# Patient Record
Sex: Male | Born: 1945 | Race: White | Hispanic: No | Smoking: Current some day smoker
Health system: Southern US, Community
[De-identification: ages and names within clinical notes are randomized; demographics above are authoritative.]

## PROBLEM LIST (undated history)

## (undated) DIAGNOSIS — I779 Disorder of arteries and arterioles, unspecified: Secondary | ICD-10-CM

## (undated) DIAGNOSIS — I2510 Atherosclerotic heart disease of native coronary artery without angina pectoris: Secondary | ICD-10-CM

## (undated) DIAGNOSIS — I639 Cerebral infarction, unspecified: Secondary | ICD-10-CM

## (undated) DIAGNOSIS — T8859XA Other complications of anesthesia, initial encounter: Secondary | ICD-10-CM

## (undated) DIAGNOSIS — T4145XA Adverse effect of unspecified anesthetic, initial encounter: Secondary | ICD-10-CM

## (undated) DIAGNOSIS — I739 Peripheral vascular disease, unspecified: Secondary | ICD-10-CM

## (undated) DIAGNOSIS — Z8659 Personal history of other mental and behavioral disorders: Secondary | ICD-10-CM

## (undated) DIAGNOSIS — R3915 Urgency of urination: Secondary | ICD-10-CM

## (undated) DIAGNOSIS — I10 Essential (primary) hypertension: Secondary | ICD-10-CM

## (undated) DIAGNOSIS — E785 Hyperlipidemia, unspecified: Secondary | ICD-10-CM

## (undated) HISTORY — PX: ROTATOR CUFF REPAIR: SHX139

## (undated) HISTORY — DX: Peripheral vascular disease, unspecified: I73.9

## (undated) HISTORY — DX: Essential (primary) hypertension: I10

## (undated) HISTORY — PX: COLONOSCOPY: SHX174

## (undated) HISTORY — DX: Disorder of arteries and arterioles, unspecified: I77.9

## (undated) HISTORY — DX: Hyperlipidemia, unspecified: E78.5

## (undated) HISTORY — PX: CHOLECYSTECTOMY: SHX55

## (undated) HISTORY — DX: Atherosclerotic heart disease of native coronary artery without angina pectoris: I25.10

---

## 2000-01-16 ENCOUNTER — Inpatient Hospital Stay (HOSPITAL_COMMUNITY): Admission: AD | Admit: 2000-01-16 | Discharge: 2000-01-20 | Payer: Self-pay | Admitting: *Deleted

## 2000-10-11 ENCOUNTER — Emergency Department (HOSPITAL_COMMUNITY): Admission: EM | Admit: 2000-10-11 | Discharge: 2000-10-11 | Payer: Self-pay | Admitting: Emergency Medicine

## 2000-10-11 ENCOUNTER — Encounter: Payer: Self-pay | Admitting: Emergency Medicine

## 2000-10-14 ENCOUNTER — Encounter: Payer: Self-pay | Admitting: Internal Medicine

## 2000-10-14 ENCOUNTER — Inpatient Hospital Stay (HOSPITAL_COMMUNITY): Admission: EM | Admit: 2000-10-14 | Discharge: 2000-10-15 | Payer: Self-pay | Admitting: Cardiology

## 2000-10-14 ENCOUNTER — Encounter: Payer: Self-pay | Admitting: Cardiology

## 2000-11-07 ENCOUNTER — Ambulatory Visit (HOSPITAL_COMMUNITY): Admission: RE | Admit: 2000-11-07 | Discharge: 2000-11-08 | Payer: Self-pay | Admitting: Cardiology

## 2000-11-16 ENCOUNTER — Encounter (HOSPITAL_COMMUNITY): Admission: RE | Admit: 2000-11-16 | Discharge: 2000-12-16 | Payer: Self-pay | Admitting: Cardiology

## 2000-12-18 ENCOUNTER — Encounter (HOSPITAL_COMMUNITY): Admission: RE | Admit: 2000-12-18 | Discharge: 2001-01-17 | Payer: Self-pay | Admitting: Cardiology

## 2001-01-19 ENCOUNTER — Encounter (HOSPITAL_COMMUNITY): Admission: RE | Admit: 2001-01-19 | Discharge: 2001-02-18 | Payer: Self-pay | Admitting: Cardiology

## 2001-02-19 ENCOUNTER — Encounter (HOSPITAL_COMMUNITY): Admission: RE | Admit: 2001-02-19 | Discharge: 2001-03-21 | Payer: Self-pay | Admitting: Cardiology

## 2001-08-03 ENCOUNTER — Ambulatory Visit (HOSPITAL_COMMUNITY): Admission: RE | Admit: 2001-08-03 | Discharge: 2001-08-03 | Payer: Self-pay | Admitting: Internal Medicine

## 2001-08-03 ENCOUNTER — Encounter: Payer: Self-pay | Admitting: Internal Medicine

## 2001-08-27 ENCOUNTER — Ambulatory Visit (HOSPITAL_COMMUNITY): Admission: RE | Admit: 2001-08-27 | Discharge: 2001-08-27 | Payer: Self-pay | Admitting: Family Medicine

## 2001-08-27 ENCOUNTER — Encounter: Payer: Self-pay | Admitting: Family Medicine

## 2001-09-09 ENCOUNTER — Inpatient Hospital Stay (HOSPITAL_COMMUNITY): Admission: EM | Admit: 2001-09-09 | Discharge: 2001-09-11 | Payer: Self-pay | Admitting: Emergency Medicine

## 2001-09-09 ENCOUNTER — Encounter: Payer: Self-pay | Admitting: *Deleted

## 2001-10-11 ENCOUNTER — Encounter: Payer: Self-pay | Admitting: Otolaryngology

## 2001-10-11 ENCOUNTER — Ambulatory Visit (HOSPITAL_COMMUNITY): Admission: RE | Admit: 2001-10-11 | Discharge: 2001-10-11 | Payer: Self-pay | Admitting: Otolaryngology

## 2002-03-05 ENCOUNTER — Encounter: Payer: Self-pay | Admitting: Emergency Medicine

## 2002-03-05 ENCOUNTER — Inpatient Hospital Stay (HOSPITAL_COMMUNITY): Admission: EM | Admit: 2002-03-05 | Discharge: 2002-03-05 | Payer: Self-pay | Admitting: Emergency Medicine

## 2002-06-16 ENCOUNTER — Emergency Department (HOSPITAL_COMMUNITY): Admission: EM | Admit: 2002-06-16 | Discharge: 2002-06-16 | Payer: Self-pay | Admitting: Internal Medicine

## 2002-06-16 ENCOUNTER — Encounter: Payer: Self-pay | Admitting: Internal Medicine

## 2002-06-18 ENCOUNTER — Ambulatory Visit (HOSPITAL_COMMUNITY): Admission: RE | Admit: 2002-06-18 | Discharge: 2002-06-18 | Payer: Self-pay | Admitting: Internal Medicine

## 2002-06-18 ENCOUNTER — Encounter: Payer: Self-pay | Admitting: Internal Medicine

## 2002-06-20 ENCOUNTER — Ambulatory Visit (HOSPITAL_COMMUNITY): Admission: RE | Admit: 2002-06-20 | Discharge: 2002-06-20 | Payer: Self-pay | Admitting: Family Medicine

## 2002-06-21 ENCOUNTER — Encounter: Payer: Self-pay | Admitting: Family Medicine

## 2002-06-21 ENCOUNTER — Ambulatory Visit (HOSPITAL_COMMUNITY): Admission: RE | Admit: 2002-06-21 | Discharge: 2002-06-21 | Payer: Self-pay | Admitting: Family Medicine

## 2002-06-28 ENCOUNTER — Ambulatory Visit (HOSPITAL_COMMUNITY): Admission: RE | Admit: 2002-06-28 | Discharge: 2002-06-28 | Payer: Self-pay | Admitting: Neurology

## 2002-06-28 ENCOUNTER — Encounter: Payer: Self-pay | Admitting: Neurology

## 2003-10-17 ENCOUNTER — Emergency Department (HOSPITAL_COMMUNITY): Admission: EM | Admit: 2003-10-17 | Discharge: 2003-10-17 | Payer: Self-pay | Admitting: Emergency Medicine

## 2004-02-06 ENCOUNTER — Inpatient Hospital Stay (HOSPITAL_COMMUNITY): Admission: EM | Admit: 2004-02-06 | Discharge: 2004-02-10 | Payer: Self-pay | Admitting: Cardiology

## 2004-02-15 ENCOUNTER — Inpatient Hospital Stay (HOSPITAL_COMMUNITY): Admission: AD | Admit: 2004-02-15 | Discharge: 2004-02-17 | Payer: Self-pay | Admitting: *Deleted

## 2004-02-15 ENCOUNTER — Encounter: Payer: Self-pay | Admitting: Emergency Medicine

## 2004-03-16 ENCOUNTER — Ambulatory Visit: Payer: Self-pay | Admitting: Psychology

## 2004-04-21 ENCOUNTER — Encounter (HOSPITAL_COMMUNITY): Admission: RE | Admit: 2004-04-21 | Discharge: 2004-05-21 | Payer: Self-pay | Admitting: Preventative Medicine

## 2004-06-25 ENCOUNTER — Encounter: Admission: RE | Admit: 2004-06-25 | Discharge: 2004-06-25 | Payer: Self-pay | Admitting: Neurosurgery

## 2004-08-20 ENCOUNTER — Ambulatory Visit (HOSPITAL_COMMUNITY): Admission: RE | Admit: 2004-08-20 | Discharge: 2004-08-21 | Payer: Self-pay | Admitting: Neurosurgery

## 2005-03-17 ENCOUNTER — Inpatient Hospital Stay (HOSPITAL_COMMUNITY): Admission: AD | Admit: 2005-03-17 | Discharge: 2005-03-20 | Payer: Self-pay | Admitting: Cardiology

## 2005-03-17 ENCOUNTER — Encounter: Payer: Self-pay | Admitting: Emergency Medicine

## 2006-06-05 ENCOUNTER — Inpatient Hospital Stay (HOSPITAL_COMMUNITY): Admission: EM | Admit: 2006-06-05 | Discharge: 2006-06-08 | Payer: Self-pay | Admitting: *Deleted

## 2007-01-25 HISTORY — PX: CORONARY ARTERY BYPASS GRAFT: SHX141

## 2007-08-20 ENCOUNTER — Encounter: Payer: Self-pay | Admitting: Emergency Medicine

## 2007-08-20 ENCOUNTER — Inpatient Hospital Stay (HOSPITAL_COMMUNITY): Admission: EM | Admit: 2007-08-20 | Discharge: 2007-08-29 | Payer: Self-pay | Admitting: Cardiology

## 2007-08-22 ENCOUNTER — Ambulatory Visit: Payer: Self-pay | Admitting: Surgery

## 2007-08-22 ENCOUNTER — Encounter (INDEPENDENT_AMBULATORY_CARE_PROVIDER_SITE_OTHER): Payer: Self-pay | Admitting: Cardiology

## 2007-09-18 ENCOUNTER — Encounter: Admission: RE | Admit: 2007-09-18 | Discharge: 2007-09-18 | Payer: Self-pay | Admitting: Surgery

## 2007-09-18 ENCOUNTER — Ambulatory Visit: Payer: Self-pay | Admitting: Surgery

## 2008-03-19 ENCOUNTER — Emergency Department (HOSPITAL_COMMUNITY): Admission: EM | Admit: 2008-03-19 | Discharge: 2008-03-19 | Payer: Self-pay | Admitting: Emergency Medicine

## 2009-02-27 IMAGING — CR DG CHEST 2V
2 series · 2 of 2 positions shown · non-contrast
Comparison: 08/27/2007

CLINICAL DATA: Chest pain/post CABG

CHEST - 2 VIEW

[view not recorded (1 of 2)]
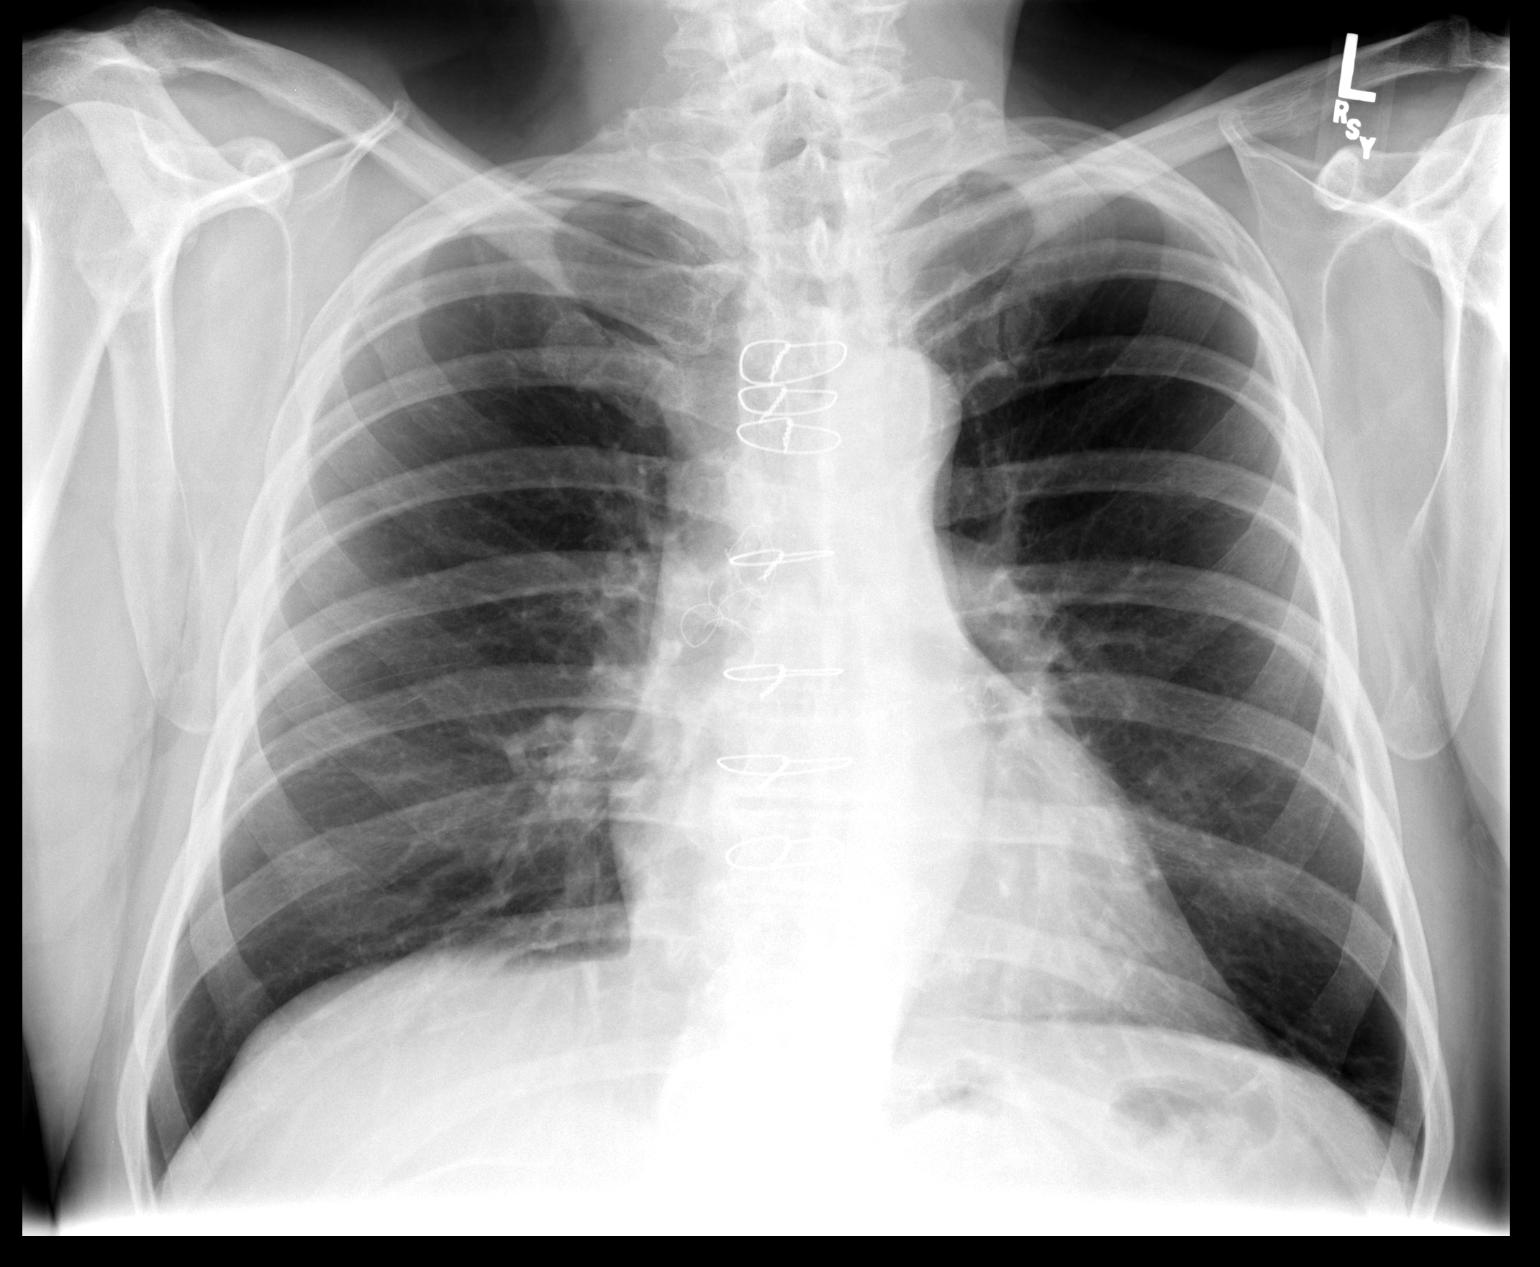

[view not recorded (2 of 2)]
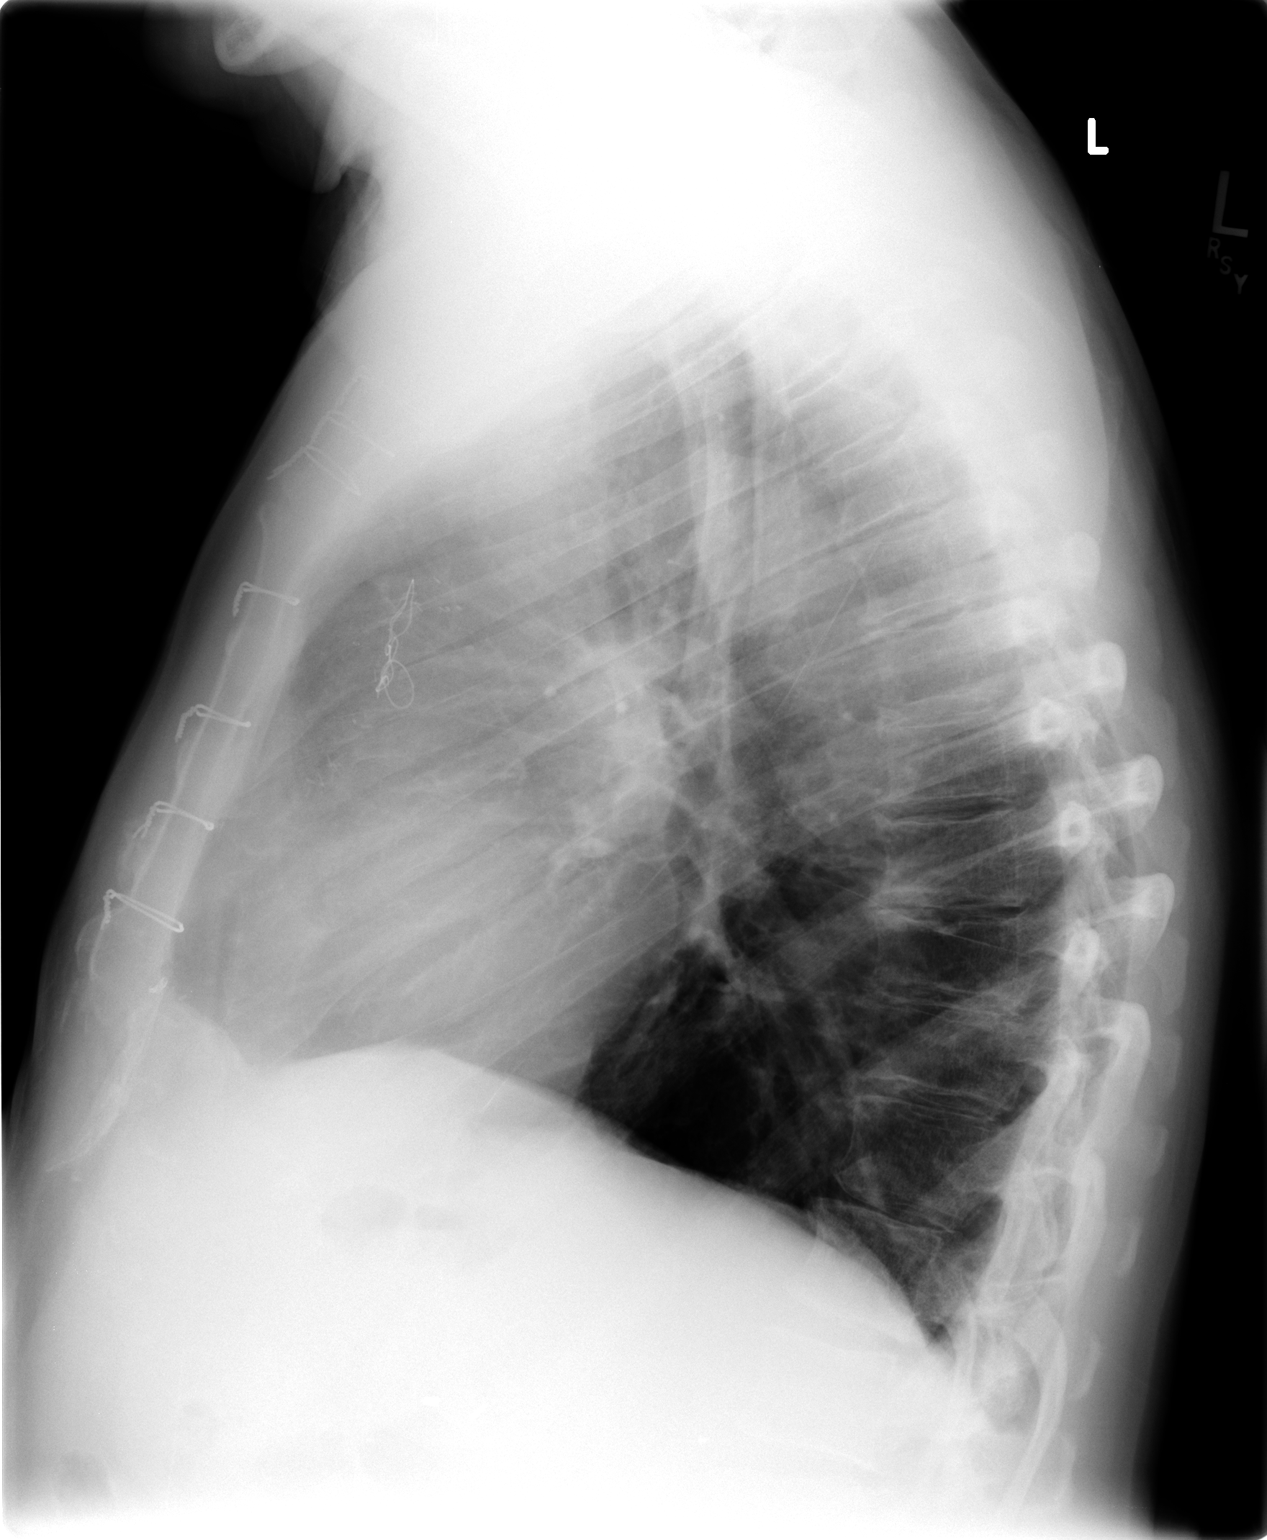

[2 of 2 positions shown; findings below may reference images not displayed]

FINDINGS: The lung bases have cleared.  There is currently no
atelectasis or pleural reaction.  Heart and mediastinal contours
normal.  The lungs are hyperaerated suggesting early COPD.  To
IMPRESSION: 1.  Status post CABG - lung bases now clear.  No active disease.
2.  Probable early COPD.

## 2010-06-08 NOTE — Cardiovascular Report (Signed)
[name], [name] NO.:  [id number]   MEDICAL RECORD NO.:  [id number]          PATIENT TYPE:  INP   LOCATION:  2924                         FACILITY:  [facility]   PHYSICIAN:  Madaline Savage, M.D.DATE OF BIRTH:  12/17/1945   DATE OF PROCEDURE:  08/21/2007  DATE OF DISCHARGE:                            CARDIAC CATHETERIZATION   PROCEDURES PERFORMED:  1. Selective coronary angiography by Judkins technique.  2. Retrograde left heart catheterization.  3. Left ventricular angiography.  4. Aortic root angiography.   COMPLICATIONS:  None.   ENTRY SITE:  Right femoral.   DYE USED:  Omnipaque.   PATIENT PROFILE:  The patient is a 65 year old gentleman with known  coronary disease, tobacco smoking, hyperlipidemia, and noncompliance  with his Plavix.  He recently entered [facility] on March 17, 2005, with chest pain suggestive of unstable angina and underwent  cardiac catheterization and at that time had a high-grade circumflex  stenosis and underwent successful angioplasty and I believe stenting.   Presently, the patient is hospitalized for discontinuation of  medication, continued use of tobacco, he has not been seen since 2007,  and he had stopped all of his medicines because they ran out and I did  not have them refilled.  He now presents with substernal chest pain.  His cardiac enzymes have become positive for non-ST-segment elevation MI  and he enters the cath lab today for diagnostic catheterization, which  was completed without complication.  No interventions were performed.   RESULTS:  Pressures:  The left ventricular pressure was 150/1, end-  diastolic pressure 12, central aortic pressure 140/85, and mean of 105.  There was a 10-mm aortic valve gradient.   ANGIOGRAPHIC RESULTS:  This patient has severe diffuse three-vessel  coronary disease!   Left main coronary artery is short and appears normal.  LAD is  eccentrically stenosed right over the  second septal perforator branch.  There are at least 7-8 septal perforator branches.  The distal LAD  becomes severely stenosed distally, and there would appear to be a  region in the mid LAD that would be potentially bypassable.  This  disease must be viewed on a viewer to understand the widespread severity  of the disease that was seen.   The diagonal branch of the LAD is severely stenosed at its bifurcation  point and it is a 2-mm vessel.   There is an optional diagonal branch, which takes off proximally and is  severely diseased, especially in the mid segment greater than 75%.   The circumflex coronary artery contains a radio-opaque stent at the  proximal portion of a large obtuse marginal branch but that stent is  patent; however, the distal runoff is into a bifurcation of the obtuse  marginal branch and that vessel is riddled with diffuse disease that  does not appear to be amenable to percutaneous intervention.  The  ongoing circumflex coursing through the atrioventricular groove,  collateralizes itself, and is severely diseased diffusely.   The right coronary artery is a medium-size vessel containing a  radiopaque stent in the midportion of the vessel; however,  there is a  recanalized occlusion of the distal RCA that appears to be receiving  small amounts of blood from homocollaterals and retrograde collaterals.  The distal RCA is not a candidate for bypass grafting.   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   Left ventricular angiography shows generalized hypokinesis with an EF  estimated at 25%.  No mitral regurgitation seen.  No LV thrombus.   Aortic root angiography shows a smooth contour to the ascending aorta,  very little in the way of plaque, and is fairly an unremarkable  ascending aorta.   FINAL IMPRESSION:  1. The patient has severe widespread three-vessel coronary disease       that appears to be poor candidate for bypass grafting.  2. Left ventricular ejection fraction is 25%.   PLAN:  The patient will be considered for continued medical therapy.  He  will be restarted on his Plavix.           ______________________________  Madaline Savage, M.D.     WHG/MEDQ  D:  08/21/2007  T:  08/22/2007  Job:  [id number]   cc:   Cristy Hilts. [name], MD

## 2010-06-08 NOTE — Op Note (Signed)
[name], NEVEL NO.:  [id number]   MEDICAL RECORD NO.:  [id number]          PATIENT TYPE:  INP   LOCATION:  2304                         FACILITY:  [facility]   PHYSICIAN:  Evelene Croon, M.D.     DATE OF BIRTH:  September 24, 1945   DATE OF PROCEDURE:  08/24/2007  DATE OF DISCHARGE:                               OPERATIVE REPORT   PREOPERATIVE DIAGNOSIS:  Severe three-vessel coronary disease status  post non-ST-segment elevation myocardial infarction.   POSTOPERATIVE DIAGNOSIS:  Severe three-vessel coronary disease status  post non-ST-segment elevation myocardial infarction.   OPERATIVE PROCEDURE:  Median sternotomy, extracorporeal circulation,  coronary artery bypass graft surgery x4 using a free left internal  mammary artery graft to the left anterior descending coronary artery,  with a saphenous vein graft to the diagonal branch of the left anterior  descending artery, a saphenous vein graft to the obtuse marginal branch  of the left circumflex coronary artery, and a saphenous vein graft to  the posterior descending coronary artery.  Endoscopic vein harvesting  from the right leg.   ATTENDING SURGEON:  Evelene Croon, MD   ASSISTANT:  Rowe Clack, [facility]   ANESTHESIA:  General endotracheal.   CLINICAL HISTORY:  This patient is a 65 year old gentleman with a  history of ongoing smoking and multiple prior percutaneous interventions  with stents as well as ischemic cardiomyopathy with an ejection fraction  of 25%-30%.  His last catheterization was performed in May 2008 that  showed multivessel diffuse disease that was treated medically.  He now  presented with a 49-month history of substernal chest pain with exertion  and this has been worsening.  He was admitted on August 20, 2007 with a  more severe episode and ruled in for a non-ST-segment elevation MI.  His  chest pain resolved and he was transferred to [facility] where he  underwent catheterization on August 21, 2007.  This showed severe diffuse  three-vessel coronary artery disease.  The LAD had about 90% proximal  stenosis just before the takeoff of a small diagonal branch.  The distal  LAD wrapped around the apex and was diffusely diseased distally with 75%  and 90% stenoses near the apex.  The left circumflex had 75% proximal  stenosis.  The previously placed stent was patent.  Then there was 80%  stenosis before the bifurcation of the large marginal branch.  One of  the subbranches was diffusely diseased and not graftable and the other  subbranch was a moderate-sized graftable vessel.  The right coronary  artery had 75% proximal stenosis and was occluded distally at the level  of the posterior descending branch with filling of the distal vessel by  collaterals.  The patient had moderate left ventricular dysfunction.  There was no significant mitral regurgitation.  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  After review of the angiogram and  examination of the patient, it was felt that coronary bypass graft  surgery was the best treatment.  I discussed the operative procedure  with the patient including alternatives, benefits, and risks including  but  not limited to bleeding, blood transfusion, infection, stroke,  myocardial infarction, graft failure, and death.  He understood and  agreed to proceed.   OPERATIVE PROCEDURE:  The patient was taken to the operating room and  placed on table in supine position.  After induction of general  endotracheal anesthesia, a Foley catheter was placed in bladder using  sterile technique.  Then the chest, abdomen, and both lower extremities  were prepped and draped in usual sterile manner.  The chest was entered  through a median sternotomy incision and the pericardium opened in the  midline.  Examination of the heart showed good ventricular  contractility.  The ascending aorta had no palpable plaques in it.   Then the left internal mammary  artery was harvested from the chest wall  as a free graft.  This was a medium caliber vessel.  When it was divided  distally, it had surprisingly good flow through it which was must have  been coming from the left subclavian artery distal to the area of  occlusion.  I still decided to use this as a free graft.  At the same  time, a segment of greater saphenous vein was harvested from the right  leg using endoscopic vein harvest technique.  This vein was of medium  size and good quality.   Then the patient was heparinized when adequate activated clotting time  was achieved.  The distal ascending aorta was cannulated using a 20-  French aortic cannula for arterial inflow.  Venous outflow was achieved  using a 2-stage venous cannula for the right atrial appendage.  An  antegrade cardioplegia and vent cannula was inserted in the aortic root.   The patient was placed on cardiopulmonary bypass and distal coronaries  were identified.  The LAD was a large graftable vessel in its  midportion.  It was diffusely diseased in the distal third and had  diffuse coronary plaque within it.  The diagonal branch was small and  diffusely diseased but felt to be graftable.  The obtuse marginal vessel  was intramyocardial throughout its entire extent, but could be located  in its distal portion where it was small but graftable.  The right  coronary artery was diffusely diseased.  The posterior descending artery  was located proximally where it was small but graftable with no distal  disease in it.  The posterolateral branches were all small vessels.  The  main body of the right coronary artery was severely diseased.   Then the aorta was crossclamped and 1000 mL of cold blood antegrade  cardioplegia was administered in the aortic root with quick arrest of  the heart.  Systemic hypothermia through 20 degrees centigrade and  topical hypothermic with iced saline was used.  A temperature probe was  placed in  septum and insulating pad in the pericardium.   The first distal anastomosis was then performed to the posterior  descending coronary artery.  The internal diameter of this vessel was  about 1.6 mm.  The conduit used was a segment of greater saphenous vein  and the anastomosis was performed in an end-to-side manner using  continuous 8-0 Prolene suture.  Flow was noted through the graft and was  excellent.   The second distal anastomosis was performed to the obtuse marginal  branch.  The internal diameter distally was about 1.6 mm.  The conduit  used was a second segment of greater saphenous vein and the anastomosis  was performed in an end-to-side manner using  continuous 7-0 Prolene  suture.  Flow was noted through the graft and was excellent.   The third distal anastomosis was performed to diagonal branch.  The  internal diameter of this vessel was about 1.4-1.5 mm.  The conduit used  was a third segment of greater saphenous vein and the anastomosis was  performed in an end-to-side manner using continuous 8-0 Prolene suture.  Flow through this vessel was fair.  There was a small vessel distally.  It was also diffusely diseased.  Then another dose of cardioplegia was  given down the vein grafts and in the aortic root.  The fourth distal  anastomosis was performed to the midportion of left anterior descending  coronary artery.  The internal diameter was about 2 mm.  The conduit  used was the free left internal mammary graft.  This was anastomosed in  an end-to-side manner using continuous 8-0 Prolene suture.  The pedicle  was sutured to the epicardium with 6-0 Prolene sutures.  The patient was  then rewarmed to 37 degrees centigrade.  With the crossclamp in place,  another dose of cardioplegia was given and then the patient was rewarmed  to 37 degrees centigrade.  The three proximal vein graft anastomoses  were performed in the aortic root end-to-side manner using continuous 6-  0  Prolene suture.  Then the proximal anastomosis of the left internal  mammary graft was performed to the hood of the obtuse marginal vein  graft in an end-to-side manner using continuous 7-0 Prolene suture.  Then the clamp was removed from the aorta with a crossclamp time of 91  minutes.  There was spontaneous return of sinus rhythm.  The proximal  and distal anastomoses appeared hemostatic while the grafts were  satisfactory.  Graft markers were placed around the proximal  anastomoses.  Two temporary right ventricular and right atrial pacing  wires were placed and brought through the skin.   When the patient had rewarmed to 37 degrees centigrade, he was weaned  from cardiopulmonary bypass on low-dose dopamine.  Total bypass time was  108 minutes.  Cardiac function appeared good with cardiac output of 4.5  L/min.  Protamine was given and venous and aortic cannulas were removed  without difficulty.  Hemostasis was achieved.  Three chest tubes were  placed with two in the posterior pericardium, one in left pleural space,  and one in anterior mediastinum.  The pericardium was loosely  reapproximated over the heart.  The sternum was closed with #6 stainless  steel wires.  The fascia was closed with continuous #1 Vicryl suture.  Subcutaneous tissues were closed with continuous 2-0 Vicryl and the skin  with 3-0 Vicryl subcuticular closure.  Lower extremity vein harvest site  was closed in layers in similar manner.  The sponge, needle, and  instrument counts were correct according to the scrub nurse.  Dry  sterile dressing was applied over the incisions around the chest tubes  which were Pleur-Evac suction.  The patient remained hemodynamically  stable and was transferred to the SICU in guarded but stable condition.      Evelene Croon, M.D.  Electronically Signed     BB/MEDQ  D:  08/24/2007  T:  08/25/2007  Job:  [id number]   cc:   Madaline Savage, M.D.

## 2010-06-08 NOTE — H&P (Signed)
NAMELONZY, MATO NO.:  [id number]   MEDICAL RECORD NO.:  [id number]          PATIENT TYPE:  INP   LOCATION:  2928                         FACILITY:  [facility]   PHYSICIAN:  Ulyses Amor, MD DATE OF BIRTH:  19-Dec-1945   DATE OF ADMISSION:  06/05/2006  DATE OF DISCHARGE:                              HISTORY & PHYSICAL   [name] is a 65 year old white man who is admitted to [facility] for further evaluation of chest pain.   The patient has an extensive history of coronary artery disease.  He has  undergone multiple percutaneous interventions.  Stents have been placed  into the obtuse marginal in October of 2002, right coronary artery in  January of 2006, LAD in January of 2006, and first obtuse marginal again  in February of 2007.  He is known to have an ischemic cardiomyopathy  with an ejection fraction in the range of 30-35% because of non-ST  elevation myocardial infarctions.   The patient presented to the emergency department at [facility] this evening with chest pain.  This began at approximately 5  p.m. while he was on the couch.  The chest pain was described as sharp  and pressure in the upper right anterior chest.  It radiated to his left  arm.  It was not associated with dyspnea, diaphoresis, or nausea.  There  were no exacerbating or ameliorating factors.  It appeared not to be  related to position, activity, meals, or respirations.  He did not take  any nitroglycerin at home.  The chest pain was ameliorated by  nitroglycerin given to him in the [facility] emergency  department.  Intravenously nitroglycerin was started, and his chest pain  has largely resolved at this time (approximately 6 hours after its  onset).   The patient believes that this chest pain is similar in quality to his  prior cardiac chest pain.  He notes multiple brief episodes of this same  chest pain at rest over the last 3  days.  He is free of chest pain at  this time and otherwise asymptomatic.   The patient is noncompliant.  He does not take any medications and he  has continued smoking.   The patient has a number of risk factors for coronary artery disease  including hypertension, dyslipidemia, and smoking (1 to 1-1/2 packs of  cigarettes per day continuing).  There is no history of diabetes  mellitus or family history of coronary artery disease.   The patient is also known to have a 50% left renal artery stenosis.  Other medical problems include chronic obstructive pulmonary disease and  a history of a transient ischemic attack.   MEDICATIONS:  None.   ALLERGIES:  BETA BLOCKER intolerance.   PAST SURGICAL HISTORY:  Back surgery, cholecystectomy, and two rotator  cuff repairs.   SOCIAL HISTORY:  The patient smokes as described above.  He drinks rare  alcohol.  He is a retired Curator.   FAMILY HISTORY:  There is no family history of coronary artery disease.  REVIEW OF SYSTEMS:  No new problems related to his head, eyes, ears,  nose, mouth, throat, lungs, gastrointestinal system, genitourinary  system, or extremities.  There is no history of neurologic or  psychiatric disorder.  There is no history of fever, chills, or weight  loss.   PHYSICAL EXAMINATION:  VITAL SIGNS:  Blood pressure 103/83, pulse 62 and  regular, respirations 15, pulse oximetry 96% on room air.  GENERAL:  The patient was a middle-aged white man in no discomfort.  He  was alert, oriented, appropriate, and responsive.  HEENT:  Normal.  NECK:  Without thyromegaly or adenopathy.  Carotid pulses were palpable  bilaterally and without bruits.  HEART:  Normal S1 and S2.  There was no S3, S4, murmur, rub, or click.  Cardiac rhythm was regular.  No chest wall tenderness was noted.  LUNGS:  Clear.  ABDOMEN:  Soft and nontender.  There was no mass, hepatosplenomegaly,  bruit, distention, rebound, guarding, or rigidity.  Bowel  sounds are  normal.  RECTAL:  GENITOURINARY:  Not performed as they are not pertinent to the  reason for acute care hospitalization.  EXTREMITIES:  Without edema, deviation, or deformity.  Radial and  dorsalis pedal pulses were palpable bilaterally.  NEUROLOGY:  Brief screening neurologic survey was unremarkable.   The electrocardiogram revealed normal sinus rhythm.  There was a  nonspecific intraventricular conduction delay.  Inferior ST segment  depression was noted suggesting the possibility of ischemia.  The chest  radiograph report was pending at the time of this dictation.   LABORATORY DATA:  Potassium 3.5, BUN 11, creatinine 1.25.  White count  was 13.5 with hemoglobin 15.3 and hematocrit 43.0.  Cardiac enzymes were  pending at the time of this dictation.  All other laboratory studies  were pending at the time of this dictation.   IMPRESSION:  1. Unstable angina.  2. Coronary artery disease, status post non-ST segment elevation      myocardial infarctions.  3. Status post multiple stents including the first obtuse marginal in      October of 2002, right coronary artery in January of 2006, left      anterior descending in January of 2006, and first obtuse marginal      again in February of 2007.  4. Ischemic cardiomyopathy.  Ejection fraction 30-35%.  5. Hypertension.  6. Dyslipidemia.  7. Peripheral vascular disease.  50% left renal artery stenosis.  8. Chronic obstructive pulmonary disease.  9. History of transient ischemic attack.  10.Noncompliance.  The patient is not taking any medications and he      continues smoking cigarettes.   PLAN:  1. Telemetry.  2. Serial cardiac enzymes.  3. Aspirin.  4. Intravenous heparin.  5. Intravenous nitroglycerin.  6. Discontinuation of smoking discussed with the patient.  7. No beta blocker due to intolerance.  8. Further measures per Darlin Priestly, MD      Ulyses Amor, MD Electronically Signed     MSC/MEDQ   D:  06/05/2006  T:  06/06/2006  Job:  [id number]   cc:   Darlin Priestly, MD

## 2010-06-08 NOTE — Discharge Summary (Signed)
[name], STOCKING NO.:  [id number]   MEDICAL RECORD NO.:  [id number]          PATIENT TYPE:  INP   LOCATION:  3704                         FACILITY:  [facility]   PHYSICIAN:  Cristy Hilts. [name], MD       DATE OF BIRTH:  1945-07-26   DATE OF ADMISSION:  06/05/2006  DATE OF DISCHARGE:  06/08/2006                               DISCHARGE SUMMARY   DISCHARGE DIAGNOSES:  1. Unstable angina, catheterization this admission revealing diffuse      coronary artery disease, plan is for medical therapy.  2. LV dysfunction with an EF of 25 to 30%.  3. Peripheral vascular disease with 60% narrowing in the aorta below      the renals with normal renal arteries and normal iliacs.  4. Smoking.  5. Non-compliance.  6. Prior coronary disease with multiple interventions in the past.   HOSPITAL COURSE:  The patient is a 65 year old male who has had several  interventions in the past.  He had an OM-1 intervention in October of  2002 with a bare metal stent, he had a DES placed to the RCA in January  of 2006, DES to the LAD January 2006, an OM-1 intervention with another  DES February 2007.  He presented Jun 05, 2006 with chest pain consistent  with unstable angina at the [facility].  He has been not taking  any of his medications and continues to smoke.  He was started on  aspirin, nitrates, and heparin, and transferred to [facility] and seen by Dr.  Orson Slick on admission.  Troponins were negative.  He was set up for  diagnostic catheterization Jun 06, 2006, which was done by Dr. Tresa Endo.  The proximal RCA has a 90% narrowing, the previously placed RCA stent  after this was patent with a total distal RCA with some faint left to  right collaterals to the distal vessel.  Circumflex was patent.  The LAD  stent was patent with a 70 to 80% narrowing immediately after the stent  and a 70% narrowing in the distal LAD.  Circumflex had a 40% narrowing  with a patent OM-1 stent.  EF was 25 to 30%.   There was some narrowing  in the distal aorta above the iliacs and below the renals.   PLAN:  Continued medical therapy.   He did spike a fever to 100.9 on Jun 07, 2006, and we kept him 24 hours.  His fever did not recur.  We feel he can be discharged Jun 08, 2006.  We  have asked for a care management consult to help him get Medicare or  Medicaid, and assistance with medications.  All of his medications are  generics.  He will follow up with Dr. [name] as an outpatient, and Dr.  Nobie Putnam.   DISCHARGE MEDICATIONS:  1. Metoprolol 50 mg one-half b.i.d..  2. Coated aspirin 325 every day.  3. Isosorbide 30 mg a day.  4. Simvastatin 40 mg a day.  5. Lisinopril 5 mg a day.  6. Nitroglycerin sublingual p.r.n.   LABORATORY DATA:  White count 8.4,  hemoglobin 12.8, hematocrit 37.5,  platelets 210.  Sodium 137, potassium 4, BUN 7, creatinine 1.  Troponins  are negative times 3.  LDL is 126, cholesterol 129, HDL 25.  Blood  cultures done on the 14th showed no growth.  Urine culture is pending.  BMP was 117.  Chest x-ray on admission showed low lung volumes with left  basilar scar.  Urinalysis unremarkable.  INR is 1.  EKG showed sinus  rhythm with a T wave inversion in V3 through V6, as well as leads 1, 2,  and L.   DISPOSITION:  The patient is discharged in stable condition.  He has  been counseled on smoking and we have tried to give him as much  assistance as we can in getting Medicaid and medication assistance.  We  will see him back in follow up in [place].      Abelino Derrick, P.A.      Cristy Hilts. [name], MD  Electronically Signed    LKK/MEDQ  D:  06/08/2006  T:  06/08/2006  Job:  [id number]   cc:   Patrica Duel, M.D.

## 2010-06-08 NOTE — Cardiovascular Report (Signed)
NAMEDENG, KEMLER NO.:  [id number]   MEDICAL RECORD NO.:  [id number]          PATIENT TYPE:  INP   LOCATION:  2928                         FACILITY:  [facility]   PHYSICIAN:  Nicki Guadalajara, M.D.     DATE OF BIRTH:  Aug 30, 1945   DATE OF PROCEDURE:  06/06/2006  DATE OF DISCHARGE:                            CARDIAC CATHETERIZATION   INDICATIONS:  [name] is a 65 year old gentleman with known  coronary artery disease.  He is status post multiple percutaneous  coronary interventions with stenting of his obtuse marginal vessel in  October 2002, right coronary artery January 2006, LAD January 2007, and  first obtuse marginal February 2007.  He has an ischemic cardiomyopathy  with an ejection fraction of 35%.  He has been noncompliant and  continues to smoke cigarettes.  He also has COPD, history of TIA.  He  was admitted to [facility] yesterday with recurrent chest pain  and possible unstable angina.  Definitive cardiac catheterization was  recommended.   PROCEDURE:  After premedication with Versed 2 mg intravenously, the  patient was prepped and draped in the usual fashion.  His right femoral  artery was punctured anteriorly and a 5-French sheath was inserted.  Diagnostic catheterization was done utilizing 5-French Judkins-4 left  and right coronary catheters.  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  Hemostasis was obtained by direct manual pressure.  The patient  tolerated the procedure well.   HEMODYNAMIC DATA:  Central aortic pressure was 177/102.  Left  ventricular pressure was 177/19.   ANGIOGRAPHIC DATA:  There was evidence for coronary calcification  involving predominantly the LAD system.   The left main coronary  was angiographically normal and bifurcated into  an LAD and left circumflex system.   The ostium of the LAD had 20-30% narrowing.  A diminutive diagonal  vessel arose from this segment.  The stent which had been placed in the  proximal LAD was widely patent.  Beyond the stented segment in the LAD  before the second diagonal vessel, there was somewhat eccentric  narrowing of 70-80% diffusely.  There was diffuse long 70% narrowing in  a small mid distal LAD system as well as 70% apical LAD and a small LAD  segment.   The circumflex vessel had 30% ostial narrowing followed by 30-40% smooth  narrowing before the first marginal vessel.  The stent which had been  placed in the proximal portion of the first marginal vessel was widely  patent.  There was 50% stenoses in a distal branch of this marginal  vessel as well as in the distal portion of this marginal vessel.   There was collateralization from the AV groove circumflex to the distal  RCA.  Collaterals were also noted to the distal RCA from the apical  portion of the LAD.   The right coronary artery had diffuse  80-90% proximal stenosis in a  small caliber vessel.  The stent in the proximal to mid RCA was patent.  The RCA before the crux was occluded.   Selective angiography in the left subclavian artery was performed in the  event CBG surgery was necessary.  This demonstrated total occlusion of  the proximal circumflex.  Selective injection was therefore made in the  right innominate system to visualize the right internal mammary artery  which was patent.   RAO ventriculography revealed LV dilation with an ejection fraction of  25-30%.  There was severe hypocontractility in the anterolateral wall  with dyskinesis of the apex and hypocontractility inferiorly.   Aortic arch aortography was performed which showed a patent right  innominate artery, 30% proximal narrowing of the carotid arising from  the arch and total occlusion of the  left subclavian vessel arising from  the arch.   Distal aortography revealed fairly normal renal arteries.  There was a  concentric narrowing of the infrarenal aorta of approximately 60-70%.   IMPRESSION:  1. Ischemic cardiomyopathy with an ejection fraction of 25-30% in this      patient status post prior myocardial infarction and multiple      coronary interventions.  2. Significant native coronary artery disease with coronary      calcification and evidence for 30% ostial LAD stenosis with a      widely patent stent in the proximal LAD but with evidence for 70-      80% narrowing in the proximal to mid LAD beyond the stented segment      before the second diagonal vessel with diffuse small caliber mid      distal LAD with narrowings of 70% diffusely; 30% ostial circumflex      narrowing with 30-40% proximal circumflex and evidence for a widely      patent stent in the OM vessel with 50% stenoses distally and with      evidence for left-to-right collateralization to the PDA/PLA system      of the distal right coronary artery.  3. Diffuse 80-90% proximal RCA stenosis with patent stent in the      proximal third of the RCA with total occlusion of the RCA before      the crux and with evidence for left-to-right collaterals as noted      above.  4. 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  5. 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   RECOMMENDATIONS:  Cineangiograms will be reviewed with colleagues.  The  patient is hypertensive.  He will require aggressive additional  increased medication.  Unfortunately, he continues to smoke and has been  noncompliant with his medications which undoubtedly has contributed to  his CAD progression.  Final recommendations will be forthcoming upon  angiographic review with colleagues and possible consultants.           ______________________________ Nicki Guadalajara, M.D.     TK/MEDQ  D:   06/06/2006  T:  06/06/2006  Job:  [id number]   cc:   Cristy Hilts. [name], MD  Patrica Duel, M.D.

## 2010-06-08 NOTE — Assessment & Plan Note (Signed)
OFFICE VISIT   [name], [name]  DOB:  April 23, 1945                                        September 18, 2007  CHART #:  [id number]   The patient returned today for followup, status post coronary artery  bypass graft surgery x4 on 08/24/2007.  He had a free left internal  mammary graft due to occlusion of the left subclavian artery at its  origin.  Since discharge, he has been feeling fairly well overall,  although he is still somewhat tired.  He has had no chest pain or  shortness of breath.   PHYSICAL EXAMINATION:  His blood pressure is 106/69.  His pulse is 69  and regular.  Respiratory rate is 18 and unlabored.  Oxygen saturation  on room air is 95%.  He looks well.  Cardiac exam shows regular rate and  rhythm with normal heart sounds.  His lung exam is clear.  The chest  incision is healing well and the sternum is stable.  His leg incision is  healing well and there is no lower extremity edema.   Followup chest x-ray shows clear lung fields and no pleural effusions.   MEDICATIONS:  1. Aspirin 81 mg daily.  2. Lisinopril 5 mg daily.  3. Plavix 75 mg daily.  4. Zocor 40 mg daily.  5. Carvedilol 6.25 mg b.i.d.  6. Oxycodone p.r.n. for pain.   IMPRESSION:  Overall, the patient is recovering well following his  surgery.  I encouraged him to continue to walk as much as possible.  I  told him he could return to driving a car at this time, but should  refrain from lifting anything heavier than 10 pounds for a total of 3  months from the  date of surgery.  He will continue to follow up with [facility] and will contact me, if he develops any problems with his  incisions.   Evelene Croon, M.D.  Electronically Signed   BB/MEDQ  D:  09/18/2007  T:  09/19/2007  Job:  [id number]   cc:   [facility]

## 2010-06-08 NOTE — Consult Note (Signed)
NAMEDEVONTRE, [name] NO.:  [id number]   MEDICAL RECORD NO.:  [id number]          PATIENT TYPE:  INP   LOCATION:  2005                         FACILITY:  [facility]   PHYSICIAN:  Evelene Croon, M.D.     DATE OF BIRTH:  [birth year]   DATE OF CONSULTATION:  08/22/2007  DATE OF DISCHARGE:                                 CONSULTATION   REFERRING PHYSICIAN:  Madaline Savage, MD   REASON FOR CONSULTATION:  Severe three-vessel coronary artery disease  status post non-ST-segment elevation MI.   CLINICAL HISTORY:  I was asked by Dr. Elsie Lincoln to evaluate [name] for  consideration of coronary artery bypass graft surgery.  He is a 65-year-  old white male smoker with a history of multiple prior PCIs with stents  as well as ischemic cardiomyopathy with ejection fraction of 25%-30%.  His last catheterization was in May 2008 showing diffuse multivessel  disease that was treated medically.  He now presents with about a 1-  month history of substernal chest pain with exertion that has been  worsening with at least one episode per day occurring for several days  with minimal exertion.  He was admitted on August 20, 2007 after having  more severe and long-lasting episode and presenting to the [facility]  Emergency Department.  He ruled in for a non-ST segment elevation MI  with a troponin of 0.28 and a CPK of 105 and MB of 6.6.  He had  resolution of his chest pain after admission and was transferred to  [facility] for further care.  Cardiac catheterization was performed on  August 21, 2007 that showed severe diffuse three-vessel disease.  The LAD  had about 90% proximal stenosis just before the takeoff of a small  diagonal branch.  The distal LAD wrapped around the apex.  The distal  vessel did have significant diffuse disease with 75% and 90% stenoses.  The left circumflex had 75% proximal stenosis and previously placed  stent was patent.  Then there was 80% stenosis before the  bifurcation of  a large marginal branch.  One of the subbranches was diffusely diseased  and probably not graftable.  The other subbranch was a moderate-sized  and graftable.  The right coronary artery had 75% proximal stenosis and  was occluded distally at the level of the posterior descending branch  with filling of the distal vessel by collaterals.  The patient had  moderate left ventricular dysfunction.  There was no significant mitral  regurgitation.  The left subclavian was also occluded at its origin.   REVIEW OF SYSTEMS:  GENERAL:  He denies any fever or chills.  He has had  no recent weight changes.  He does report fatigue for several months.  EYES:  Negative.  ENT:  Negative.  ENDOCRINE:  He denies diabetes and  hypothyroidism.  CARDIOVASCULAR:  As above.  Denies PND or orthopnea.  He has had exertional dyspnea.  He denies peripheral edema and  palpitations.  RESPIRATORY:  He denies cough and sputum production.  GI:  He has had no nausea or vomiting.  He denies melena and bright red blood  per rectum.  GU:  He denies dysuria and hematuria.  MUSCULOSKELETAL:  He  denies arthralgias and myalgias.  HEMATOLOGIC:  Negative.  ALLERGIES:  None.  NEUROLOGICAL:  He denies any focal weakness or numbness.  He  denies dizziness and syncope.  He has never had a TIA or stroke.   MEDICATIONS PRIOR TO ADMISSION:  Aspirin, Lopressor 25 mg b.i.d., Imdur  30 mg daily, Zocor 40 mg daily, lisinopril 5 mg daily, and sublingual  nitroglycerin p.r.n.  Of note is that he ran out of his medicines about  2 months ago and said that he never had them refilled and has not been  taking any of them.   PAST MEDICAL HISTORY:  Significant for history of coronary disease as  mentioned above, status post multiple PCIs and stents.  He had a bare-  metal stent in his obtuse marginal in 2002, a drug-eluting stent in his  right coronary in 2006, a stent in his LAD in 2007, and a stent in his  first marginal in 2007.   He has history of ischemic cardiomyopathy with  ejection fraction of 25%-30%.  He has history of peripheral vascular  disease involving his abdominal aorta and lower extremities.  He is  status post back surgery.  He is status post cholecystectomy.  He is  status post rotator cuff surgery.   SOCIAL HISTORY:  He is retired and lives alone.  He smokes one pack of  cigarettes per day and drinks about 2-3 drinks per week of alcohol.   FAMILY HISTORY:  Negative for cardiac disease.   PHYSICAL EXAMINATION:  VITAL SIGNS:  Blood pressure was 115/92 with a  pulse of 70 and regular.  Respiratory rate 16 and unlabored.  GENERAL:  He is a well-developed white male in no distress.  HEENT:  Normocephalic and atraumatic.  Pupils are equal and reactive to  light and accommodation.  Extraocular muscles were intact.  Oral exam  shows poor dentition.  NECK:  Normal carotid pulses bilaterally.  There were no bruits.  There  is no adenopathy or thyromegaly.  CARDIAC:  Regular rate and rhythm with normal heart sounds.  LUNGS:  Clear.  ABDOMEN:  Active bowel sounds.  His abdomen is soft and nontender.  No  palpable masses or organomegaly.  EXTREMITIES:  No peripheral edema.  Pedal pulses are palpable  bilaterally.  His radial pulses are palpable bilaterally but is  diminished on the left compared to the right.  His extremities are pink  and warm and well-perfused.  NEUROLOGIC:  Alert and oriented x3.  Motor and sensory exams are grossly  normal.   Electrocardiogram shows sinus bradycardia with left ventricular  hypertrophy with repolarization abnormality.  He has anterior T-wave  inversions.   IMPRESSION:  [name] has severe diffuse three-vessel coronary  disease presenting with unstable angina and non-ST segment elevation  myocardial infarction.  While he is not an ideal candidate for coronary  artery bypass surgery due to the diffuse nature of his coronary disease,  I think it is the best  long-term treatment for him.  His left anterior  descending and diagonal as well as the obtuse marginal and posterior  descending appeared graftable.  His long-term results will be more  unpredictable given the diffuse nature of his coronary disease, but I  think results will be considerably better than continued medical  treatment.  I did discuss the importance of maximum cardiac risk factor  reduction and medical compliance including complete smoking cessation  and taking all of his medications as directed.  He understands all this  and would like to proceed with surgery.  We did discussed the benefits  and risks of surgery including but not limited to bleeding, blood  transfusion, infection, stroke, myocardial infarction, graft failure,  and death.  We will attempt and plan to do surgery on Friday, August 24, 2007.      Evelene Croon, M.D.  Electronically Signed     BB/MEDQ  D:  08/23/2007  T:  08/24/2007  Job:  [id number]   cc:   Cristy Hilts. [name], MD

## 2010-06-08 NOTE — Discharge Summary (Signed)
[name], [name] NO.:  [id number]   MEDICAL RECORD NO.:  [id number]          PATIENT TYPE:  INP   LOCATION:  2015                         FACILITY:  [facility]   PHYSICIAN:  Evelene Croon, M.D.     DATE OF BIRTH:  1945/05/17   DATE OF ADMISSION:  08/20/2007  DATE OF DISCHARGE:  08/29/2007                               DISCHARGE SUMMARY   HISTORY OF PRESENT ILLNESS:  The patient is a 65 year old white male  with a long history of coronary artery disease and ischemic  cardiomyopathy.  He has had multiple previous PCI and stent  interventions over the past approximately 7 years.  The patient had not  been seen in the cardiology office since 2007 and he reports that he  stopped taking all his medications greater than 2 months ago.  He  reported that they ran out and he did not get them refilled.  Recently,  he has had approximately 1 month history of substernal chest pain with  exertion that has progressively increased to approximately 1 episode per  day.  These episodes were worse, relieve with rest and associated with  radiation to the left arm.  There was no associated shortness of breath,  nausea, or diaphoresis.  On the date of admission, the episode was more  severe and lasted longer causing him to present to the [facility]  Emergency Department.  He was placed on a nitroglycerin and heparin drip  and transferred to [facility] for unstable angina for further  evaluation and treatment.   ALLERGIES:  He reportedly has a history of beta-blocker intolerance but  has been on metoprolol until approximately 2 months ago without  significant complaints.   Home were medications were stopped.  Medications that he was suppose to  be taking were as follows;  1. Aspirin 1 daily.  2. Lopressor 25 mg twice daily.  3. Imdur 30 mg daily.  4. Simvastatin 40 mg daily.  5. Lisinopril 5 mg daily.  6. Sublingual nitroglycerin p.r.n.   Past medical history includes  ischemic cardiomyopathy with reported  ejection fraction of 25-30%.   OTHER DIAGNOSES:  1. Coronary artery disease with previous non-ST-segment elevation      myocardial infarction.  2. History of multiple previous percutaneous coronary interventions.  3. History of hypertension.  4. History of peripheral vascular occlusive disease.  5. History of back surgery.  6. History of cholecystectomy.  7. History of rotator cuff repairs.  8. History of noncompliance.   Family history, social history, review of systems, and physical exam,  please see the history and physical note at the time of admission.   HOSPITAL COURSE:  The patient was admitted.  He was continued on  intravenous heparin for unstable angina.  He did rule in for the non-ST-  segment elevation myocardial infarction.  Cardiac catheterization was  felt to be necessary for further evaluation of his current coronary  anatomy to better determine a plan.  This was done on August 21, 2007.  Findings included severe diffuse coronary artery disease.  Due to these  findings, surgical  consultation was obtained with Evelene Croon who  evaluated the patient and studies and recommended surgical  revascularization as his best option.  This was discussed with the  patient and agreed upon and on August 24, 2007, the following procedure  was performed.   PROCEDURE:  Coronary artery bypass grafting x4 the following grafts were  placed.  1. Free left internal mammary artery to the LAD.  2. Saphenous vein graft to the posterior descending.  3. Saphenous vein graft to the obtuse marginal.  4. Saphenous vein graft to the diagonal.   INTRAOPERATIVE FINDINGS:  Diffuse coronary artery disease.  Note the  left internal mammary artery was used as free graft due to subclavian  stenosis.  The patient tolerated the procedure well and was taken to the  surgical intensive care unit in a stable condition.   POSTOPERATIVE HOSPITAL COURSE:  Overall, the  patient has done quite  well.  He was extubated without difficulty, neurologically intact.  He  initially required Nipride for hypertension but this was weaned as were  all lines, monitors, and drain devices in the standard fashion.  He does  have a postoperative acute blood loss anemia and did require  transfusion.  These values have stabilized.  His most recent hemoglobin  and hematocrit dated August 28, 2007, are 9.6 and 28 respectively.  Electrolytes, BUN, and creatinine are within normal limits.  Oxygen has  been weaned and he maintained good saturations on room air.  His  incisions are healing well without evidence of infection.  He has  maintained a normal sinus rhythm without significant cardiac ectopy or  dysrhythmias.  He has responded well to a gentle diuresis.  He has  advanced in a routine manner using standard postop cardiac  rehabilitation protocols.  His capillary blood glucoses had been under  good control initially with the Glucommander protocol and then Lantus  with sliding scale.  His overall status was felt to be tentatively  stable for discharge in the morning of August 29, 2007, pending morning  round re-evaluation.   INSTRUCTIONS:  The patient will receive written instructions in regards  to medications, activity, diet, wound care, and followup.   FOLLOWUP:  Include Dr. [name], 2 weeks postdischarge and Dr. Laneta Simmers on  September 18, 2007, at 11:45 at the [facility] office.   Medications on discharge are as follows;  1. Aspirin 325 mg daily.  2. Lopressor 25 mg twice daily.  3. Zocor 40 mg daily.  4. Lisinopril 5 mg daily.  5. Oxycodone 5 mg 1-2 every 4-6 hours as needed for pain.   FINAL DIAGNOSES:  1. Severe multivessel coronary artery disease with unstable angina.  2. Non ST-segment elevation myocardial infarction on this admission,      now status post surgical revascularization as described.   OTHER DIAGNOSES:  1. Ischemic cardiomyopathy.  2. Multiple previous  percutaneous interventions.  3. Hypertension.  4. Peripheral vascular occlusive disease.  5. History of previous back surgery.  6. History of previous cholecystectomy.  7. History of rotator cuff repairs.  8. History of medical noncompliance.  9. Postoperative acute blood loss anemia.      Rowe Clack, P.A.-C.      Evelene Croon, M.D.  Electronically Signed    WEG/MEDQ  D:  08/28/2007  T:  08/29/2007  Job:  [id number]   cc:   Cristy Hilts. [name], MD  Evelene Croon, M.D.

## 2010-06-11 NOTE — H&P (Signed)
NAME:  [name], [name]                         ACCOUNT NO.:  [id number]   MEDICAL RECORD NO.:  [id number]                   PATIENT TYPE:  INP   LOCATION:  IC04                                 FACILITY:  [facility]   PHYSICIAN:  Gracelyn Nurse, M.D.              DATE OF BIRTH:  1945/02/07   DATE OF ADMISSION:  03/05/2002  DATE OF DISCHARGE:                                HISTORY & PHYSICAL   CHIEF COMPLAINT:  Chest pain.   HISTORY OF PRESENT ILLNESS:  This is a 65 year old white male with known  history of coronary artery disease, who presents with chest pain.  The pain  is substernal and does not radiate.  It awoke him from sleep.  He has had no  associated shortness of breath, no diaphoresis, no nausea.  The pain was  relieved by three nitroglycerins.  This pain was similar to chest pain he  has had before when he has had to have stents placed.   PAST MEDICAL HISTORY:  1. Coronary artery disease.     a. 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  2. Hyperlipidemia.  3. GERD.  4. Depression.  5. Chronic headache.     a. Status post nerve block.     b. Status post rotator cuff surgery.   ALLERGIES:  No known drug allergies.   CURRENT MEDICATIONS:  1. Celexa 40 mg daily.  2. Protonix 40 mg daily.   SOCIAL HISTORY:  He drinks alcohol occasionally.  He smokes one and a half  packs of cigarettes per day.  He is married with three children.  He works  as an Insurance account manager.   FAMILY HISTORY:  Mother died at the age of 46 of congestive heart failure.  Father died at age 64 from a stroke.  He has one brother with hypertension.   REVIEW OF SYSTEMS:  Review of systems as per HPI and remainder of systems  negative.   PHYSICAL EXAMINATION:  VITAL SIGNS:  Pulse is 66, respirations 20, blood  pressure  134/72.  GENERAL:  This is a well-nourished white male in no acute distress.  HEENT:  Pupils are equal, round and reactive to light.  Extraocular  movements intact.  Oral mucosa is moist.  Oropharynx is clear.  CARDIOVASCULAR:  Regular rate and rhythm.  No murmurs.  LUNGS:  Lungs clear to auscultation.  ABDOMEN:  The abdomen is soft, nontender and nondistended.  Bowel sounds  positive.  EXTREMITIES:  No edema.  NEUROLOGIC:  Cranial nerves II-XII grossly intact.  No focal deficits.  SKIN:  Skin is moist with no rash.   LABORATORY AND ACCESSORY CLINICAL DATA:  The EKG shows normal sinus rhythm,  no ST abnormalities.  Admission labs:  White blood cell count is 10.6, hemoglobin 13, platelets  272,000.  Sodium 135, potassium 4.0, chloride 107, CO2 29, BUN 9, creatinine  1.0, glucose 112.  CK-MB is 2.7 and troponin I is 0.02.   ASSESSMENT AND PLAN:  1. Chest pain.  The patient does have a known history of coronary artery     disease.  I am going to go ahead and start aspirin, beta blocker and     Lovenox, give him nitroglycerin p.r.n.  He is currently pain-free.  The     emergency room physician spoke with Dr. Aram Candela. Tysinger, who is on call     for [facility].  They were going to make arrangements to     transfer to [facility], however, there were no beds available.     He is stable and pain-free at this time, so we are going to keep him at     [facility], admit him to telemetry and rule him out.  We will consult     [facility] in the morning to see him to see if they are     going to stress him up here or send him to [facility] for a cardiac     catheterization.  Going over his medication list, he is no longer on     aspirin and his ACE inhibitor and his hyperlipidemic drugs.  I do not     understand at this point why he is off of these, but I will hold off     restarting his ACE inhibitor because of his blood pressure and will hold     off on his statin  drugs and recheck a fasting lipid panel.  2. Gastroesophageal reflux disease.  We will continue his Protonix.  3. Depression.  We will continue his Celexa.                                               Gracelyn Nurse, M.D.    JDJ/MEDQ  D:  03/05/2002  T:  03/05/2002  Job:  [id number]

## 2010-06-11 NOTE — Discharge Summary (Signed)
[facility]  Patient:    [name], [name]                      MRN: [id number] Adm. Date:  [id number] Disc. Date: [id number] Attending:  Otilio Saber                           Discharge Summary  BRIEF HISTORY:  [name] is a 65 year old white married male admitted with a history of acute depression and suicidal ideation.  The patient reports that he learned graphic details of his wifes affair from her email and had become markedly distraught.  He confronted her and had been unable to work subsequently due to his emotional distress.  He began drinking and parked his car in the church parking lot with thoughts of killing himself with carbon monoxide.  He was found by friends and taken to the [facility] ER.  He reported decreased sleep and decreased appetite recently.  PAST PSYCHIATRIC HISTORY:  The patient had been hospitalized at [facility] in 1995 with depression and apparently had been treated with Tranxene.  He checked himself out AMA because he did not like the group leaders language.  He had no subsequent psychiatric follow-up.  He denied any history of substance abuse.  PAST MEDICAL HISTORY:  He is followed medically by Dr. Patrica Duel in [place], [place].  He had a history of chronic indigestion but denied any other significant medical problems.  He had two surgeries for rotator cuff injuries and had also had a cholecystectomy.  ADMISSION MEDICATIONS:  Xanax 0.25 mg p.r.n.  ALLERGIES:  He had no known drug allergies.  PHYSICAL EXAMINATION:  Performed at [facility] with no significant findings.  His blood alcohol level was 83 and he was slightly hypokalemic and was given 20 mEq of K-Dur.  MENTAL STATUS EXAMINATION:  On admission, casually-dressed white male.  Speech was normal.  Thought processes were logical and coherent without evidence of psychosis.  He had some passive suicidal ideation but was able to  contract for safety on the unit.  Mood and affect were fairly euthymic.  Oriented x 3. Cognitive function was intact.  ADMITTING DIAGNOSES: Axis I:    1. Adjustment disorder.            2. Rule out major depression. Axis II:   Deferred. Axis III:  Rule out gastritis. Axis IV:   Psychosocial stressors severe. Axis V:    Global Assessment of Functioning:  Current 45; highest past year            24.  LABORATORY FINDINGS:  Admission CBC was normal.  Blood chemistries were normal.  Thyroid panel was normal.  Urinalysis was normal.  HOSPITAL COURSE:  Patient was admitted to the [facility] for treatment of his depression and to prevent any suicidal acting out.  He continued to feel distraught about his current situation and was placed on a combination of Celexa and Zyprexa.  He remained distraught over his marital difficulties but was able to gradually calm.  He was denying any further suicidal ideation, saying he had too much to live for with his grandchildren. He felt that he could move on with his life, however.  He was sleeping and eating better and it was felt he was safe enough to be managed again on an outpatient basis.  CONDITION ON DISCHARGE:  Patient discharged in improved condition with  improvement in his mood, sleep and appetite and alleviation of his suicidal ideation.  DISPOSITION:  Patient was discharged home.  FOLLOW-UP:  Dr. Nobie Putnam on February 01, 2000 and was to talk with his [facility] worker.  DISCHARGE MEDICATIONS: 1. Celexa 30 mg q.a.m. 2. Xanax 0.25 mg q.i.d. 3. Zyprexa 2.5-5 mg q.h.s. 4. Trazodone 50 mg q.h.s. 5. Pepcid 20 mg b.i.d.  FINAL DIAGNOSES: Axis I:    Depression not otherwise specified. Axis II:   No diagnosis. Axis III:  History of gastritis. Axis IV:   Psychosocial stressors severe. Axis V:    Global Assessment of Functioning:  Current 50; highest in past year            75. DD:  03/03/00 TD:  03/04/00 Job: [id number]

## 2010-06-11 NOTE — Discharge Summary (Signed)
[name], [name] NO.:  [id number]   MEDICAL RECORD NO.:  [id number]          PATIENT TYPE:  INP   LOCATION:  6532                         FACILITY:  [facility]   PHYSICIAN:  Cristy Hilts. [name], MD       DATE OF BIRTH:  08-22-1945   DATE OF ADMISSION:  02/06/2004  DATE OF DISCHARGE:  02/10/2004                                 DISCHARGE SUMMARY   ATTENDING PHYSICIAN:  Madaline Savage, M.D.   DISCHARGE DIAGNOSES:  1.  Unstable angina pectoris - status post PCI during this admission.  2.  Previously known coronary artery disease with prior stenting of the      obtuse marginal with residual disease in the right coronary artery.  3.  Hypertension.  4.  Dyslipidemia.  5.  Depression.  6.  Gastroesophageal reflux disease.  7.  Tobacco abuse.   DISCHARGE MEDICATIONS:  1.  Plavix 75 mg daily.  2.  Aspirin 81 mg daily.  3.  Lorazepam 0.5 mg b.i.d.  4.  Quinapril 10 mg daily.  5.  Lexapro 20 mg daily.  6.  Protonix 40 mg daily.  7.  Imdur 30 mg daily.  8.  Advicor 20/500 mg daily.  9.  Verapamil 120 mg daily.   HISTORY OF PRESENT ILLNESS/HOSPITAL COURSE:  This is a 65 year old gentleman  with prior history of coronary artery disease status post catheterization in  October of 2002 with stenting of the obtuse marginal, residual disease in  the mid RCA who developed an episode of chest pain while he was at work.  He  denied any shortness of breath or diaphoresis and no radiation of the pain  but at the same time he took nitroglycerin and it helped to relieve pain.  He went to the emergency room to get checked because pain was worrisome to  him and then had another episode of pain in the emergency room.  That is  when he was admitted for evaluation of unstable angina pectoris.   He was admitted to [facility] as rule out MI protocol.  His enzymes were negative.  The EKG did not reveal any significant or acute  STT wave changes.   Dr. [name]  evaluated the patient and made decision to keep him in the  hospital over the weekend for catheterization on Monday, February 09, 2004.   PROCEDURE:  Cardiac catheterization performed on February 09, 2004, and he  noted there were two high grade lesions in the proximal LAD with 80%  diameter reduction of the vessel and 99% to the RCA with aneurysmal  dilatation of the artery just beyond the lesion.  Dr. [name] inserted CYPHER  stent in that RCA lesion with reduction from 99 to 0%.  The patient also has  disease in the circumflex artery.  Proximal circumflex has 60% stenosis.  A  previously inserted stent into obtuse marginal I is patent and obtuse  marginal II had 60% ostial stenosis.  LAD also has distal 90% stenosis.  The  patient tolerated the procedure well, was given 2b 3a substance for bolus  and drip and transferred to telemetry floor in stable condition.   The day of discharge, his blood work was in normal range.  He did not have  any complaints of chest pain, no shortness of breath, no problems with the  groin puncture site.   Dr. [name] assessed the patient and deemed him stable for discharge home.   DISCHARGE INSTRUCTIONS:  No driving.  No lifting greater than five pounds.  No strenuous activity for five days.   DISCHARGE DIET:  Low carb, low cholesterol diet.   The patient was instructed to return to work next Saturday and also stop  clonidine.   Dr. [name] will see the patient on February 23, 2004, at 3:30 p.m.  He also  mentioned in his progress note on the day of discharge that he would review  the films with colleagues and discuss possible PCI for LAD lesion versus  Cardiolite.      Mari   MK/MEDQ  D:  02/10/2004  T:  02/10/2004  Job:  [id number]

## 2010-06-11 NOTE — Cardiovascular Report (Signed)
NAMEKING, PINZON NO.:  [id number]   MEDICAL RECORD NO.:  [id number]          PATIENT TYPE:  INP   LOCATION:  6599                         FACILITY:  [facility]   PHYSICIAN:  Cristy Hilts. [name], MD       DATE OF BIRTH:  Apr 09, 1945   DATE OF PROCEDURE:  02/09/2004  DATE OF DISCHARGE:                              CARDIAC CATHETERIZATION   REFERRED BY:  Patrica Duel, M.D.   PROCEDURE:  1.  Left ventriculography.  2.  Left coronary arteriography.  3.  Percutaneous transluminal coronary angiography and stenting of the mid-      right coronary artery with a 3.0 mm x 13.0 mm CYPHER stent.  4.  Inter-coronary nitroglycerin administration.   INDICATIONS FOR PROCEDURE:  The patient is a 65 year old Caucasian male with  a history of known coronary artery disease, status post angioplasty of the  obtuse marginal-I branch of the circumflex on November 07, 2000, with a 2.75  x 13.0 mm ZETA stent for chest pain, suggestive of angina pectoris.  He was  readmitted to the hospital after he experienced an episode of 15 minutes of  chest pain, very typical for unstable angina.  Given his multiple cardiac  risk factors which include hypertension, mixed hyperlipidemia and continued  smoking and his presentation, he was brought to the cardiac catheterization  laboratory to evaluate his coronary anatomy.   HEMODYNAMIC DATA:  Left ventricular pressure:  109/5.  End diastolic pressure:  11 mmHg.  Aortic pressure:  107/31, with a mean of 87 mmHg.  There was no pressure gradient across the aortic valve.   ANGIOGRAPHIC DATA:  Left ventricle:  The left ventricle size and function  was normal with an ejection fraction estimated at 60%.  There was no  significant mitral regurgitation.   RESULTS:  1.  RIGHT CORONARY ARTERY:  The right coronary artery is a large dominant      vessel.  It has mild diffuse luminal irregularity throughout.  There is      a high-grade 99% stenosis noted in the  mid-segment of the RCA, followed      by post-stenotic aneurysmal dilatation.  This lesion has progressed from      40%-60% from prior cardiac catheterization.  The RCA itself continues as      a large PDA and a large PLA which have diffuse disease.  The PLA has      ostial 50%-60% stenosis.  2.  LEFT MAIN:  The left main coronary artery is a large caliber vessel      which has mild calcification.  3.  LEFT ANTERIOR DESCENDING CORONARY ARTERY:  The left anterior descending      coronary artery is a large caliber vessel.  It has mild tortuosity.  In      the proximal segment at the origin of a large septal perforator there is      an eccentric 80% stenosis.  This is followed by the origin of a moderate-      sized diagonal-I which has mild luminal irregularity.  The LAD itself  has diffuse luminal irregularity in the distal LAD, just before reaching      the apex has a 90% stenosis; however, this vessel is very small.  4.  CIRCUMFLEX CORONARY ARTERY:  The circumflex coronary artery is a large      caliber vessel.  It continues as a large obtuse marginal-I after giving      an AV groove branch.  The AV groove branch has ostial 60% stenosis and      has diffuse disease otherwise.  The obtuse marginal-I is widely patent,      and has a widely patent stent in its mid-segment.  The distal obtuse      marginal has mild diffuse disease.   PROCEDURE:  An interventional six-vessel percutaneous transluminal coronary  angiography and stenting of the RCA with a 3.0 mm x 13.0 mm CYPHER deployed  at 16 atmospheres of pressure.  This stent was post-dilated with a 3.0 mm x  9.0 mm Power Sail at 18 atmospheres of pressure.  Overall the stenosis was  reduced from 99% to 0%.  There was exclusion of the annulus per dilatation  in its mid-segment.  Excellent TIMI-3 flow was maintained at the end of the  procedure.   RECOMMENDATIONS:  The patient will be advised of risk factor modification.  The patient  will be brought back for an elective percutaneous transluminal  coronary angiography of the proximal LAD.  This will be done in about one  week to two weeks.   DESCRIPTION OF PROCEDURE:  The usual sterile preparation using a 6-French  right femoral artery access and a 6-French multi-purpose PT catheter was  advanced into the ascending aorta.  The catheter was gently advanced to the  left ventricle.  The patient was monitored.  Hand contrast injection of the  left ventricle was performed both in the medial and lateral projection.  The  catheter was flushed and then a pullback into the ascending aorta and  pressure gradient across the ascending aorta was monitored.  The right  coronary artery was then selectively engaged and an angiography was  repeated.  Then the catheter was manipulated and engaged in the left middle  coronary artery and an angiography was repeated.  Then the catheter was  pulled out of the body in the usual fashion.   TECHNIQUE OF INTERVENTION:  A 6-French sheath was exchanged for a 7-French  sheath.  Then a 7-French FR4 guide was used to engage the right coronary  artery in the usual fashion.  Then a 190 cm x 0.014 inch ATW marker guide  was utilized to cross into the RCA, and the lesion length was carefully  measured.  This lesion measured approximately about 10 mm.  Then because of  the high-grade stenosis, a 3.0 mm x 8.0 mm Quantum Maverick balloon was  utilized and gentle dilatations in the lesions were performed.  There was a  tight waste noted at the tightest spot.  The balloon dilatations were done  at 4, 6 and 10 atmosphere pressure for 32 to 60 seconds.  A repeat  angiography revealed the presence of residual 40%-50% stenosis.  Because of  the presence of residual stenosis and also aneurysmal dilatation, it was  decided to proceed with stenting.  Then a 3.0 mm x 13.0 mm CYPHER stent was advanced through the lesion, and after carefully positioning the stent,  the  stent was deployed at 16 atmosphere pressure for 60 seconds.  The stent  balloon was deflated  and pulled back into the guiding catheter and an  angiography was repeated.  Then the 3.0 mm x 8.0 mm Power Sail balloon was  re-advanced back through the guiding catheter, and keeping the balloon  within the stent, 18 atmospheres of balloon dilatation x2 was performed  within the stent.  Then the balloon was deflated and pulled back in the  guiding catheter and 100 mcg of inter-coronary nitroglycerin was  administered.  Angiography was repeated.  Excellent results were noted.  After  confirming the success of the results in multiple views, the catheter was  disengaged and pulled out of the body in the usual fashion.   The patient tolerated the procedure well.  No immediate complications were  noted.      Kirk Ruths  D:  02/09/2004  T:  02/09/2004  Job:  [id number]   cc:   Patrica Duel, M.D.  [address], [address]  [place]  [place] [address]  Fax: [phone]

## 2010-06-11 NOTE — Discharge Summary (Signed)
[facility]  Patient:    TINA, TEMME Visit Number: [id number] MRN: [id number]          Service Type: CAT Location: 3700 [id number] Attending Physician:  Pamella Pert Dictated by:   Halford Decamp Delanna Ahmadi, R.N., N.P. Admit Date:  11/07/2000 Discharge Date: 11/08/2000   CC:         Dr. Patrica Duel   Discharge Summary  [facility] COURSE:  Mr. Robby Sermon is a 65 year old white married male patient of Dr. Delrae Rend who was admitted for outpatient cardiac catheterization. He had previously been admitted on October 14, 2000 to October 15, 2000 with chest pain.  He was negative for an MI and he was sent home for an outpatient Cardiolite.  This was performed and it showed anterior and inferior wall thinning with a suggestion of very minimal amount of inferior wall ischemia with an EF of 53%, and he was recommended for outpatient cardiac catheterization.  He came in on November 07, 2000.  This was performed by Dr. Elsie Lincoln.  He had an EF of 55%.  He had a 75% mid circumflex lesion and he had 60% and 40% proximal lesions in his RCA with an aneurysm in-between the lesions.  He went on to have a stent placed in his left circumflex OM-1, reduced from 75% to 0.  In the hospital, his initial lipid profile during his first hospitalization showed a high triglyceride level of 700 with a low HDL of 33; he was put on Tricor.  A repeat cholesterol level this admission revealed a total cholesterol 249, triglycerides 203, HDL 39, and LDL 169; thus, Zocor was added to his medications.  On November 08, 2000 he was seen by Dr. [name], considered stable for discharge home.  We discussed with him his lifestyle changes that needed to be made.  He is agreeable to quitting smoking now.  He is also interested in changing his diet; thus, he will follow up in the lipid clinic at our office.  He was given some instructions about diet prior to his discharge.  CURRENT  MEDICATIONS: 1. Enteric-coated aspirin 325 mg once per day. 2. Plavix 75 mg once per day x 4 weeks. 3. Norvasc 5 mg once per day. 4. Celexa 40 mg once per day. 5. Tricor 60 mg once per day. 6. Protonix 40 mg once per day or Prevacid as taken previously. 7. Accupril 10 mg once per day. 8. Zocor 20 mg once per day.  ACTIVITY:  He should not return to work until October 28 per Dr. [name] and he should do no strenuous activity for four days and no driving x 2 days.  DIET:  He should not eat more than 15 g of saturated fat per day.  He should eat at least seven servings of fruit and vegetables per day.  WOUND CARE:  If he has any problems with his groin he will call our office.  FOLLOW-UP:  He will follow up in the lipid clinic on October 31 at 10 a.m. to discuss his diet.  His wife will be present at that time.  He will follow up with Dr. [name] November 4 at 3 p.m.  DISCHARGE DIAGNOSES: 1. Angina and abnormal Cardiolite test prior to admission. 2. Coronary artery disease status post cardiac catheterization with two-vessel    coronary artery disease, with subsequent left circumflex first obtuse    marginal stent placed.  He has remaining lesion in his proximal right  coronary artery with a 60% and 40% blockage with an aneurysm in-between,    for which Dr. [name] has recommended percutaneous transluminal coronary    angioplasty of his right coronary artery if he has recurrent chest pain. 3. Mixed hyperlipidemia with an atherogenic lipid profile, with low HDL, high    LDL, and high triglyceride level. 4. Tobacco smoking.  Patient is now willing to quit. 5. Gastroesophageal reflux disease. 6. Depression. Dictated by:   Halford Decamp Delanna Ahmadi, R.N., N.P. Attending Physician:  Pamella Pert DD:  11/08/00 TD:  11/08/00 Job: [id number]

## 2010-06-11 NOTE — Procedures (Signed)
   NAME:  [name], [name]                         ACCOUNT NO.:  [id number]   MEDICAL RECORD NO.:  [id number]                   PATIENT TYPE:  INP   LOCATION:  IC04                                 FACILITY:  [facility]   PHYSICIAN:  Edward L. Juanetta Gosling, M.D.             DATE OF BIRTH:  Jun 04, 1945   DATE OF PROCEDURE:  03/05/2002  DATE OF DISCHARGE:  03/05/2002                                EKG INTERPRETATION   0034 March 05, 2002:  The rhythm is a sinus rhythm with a rate of 60.  Questionable left ventricular hypertrophy.  Abnormal electrocardiogram.                                               Oneal Deputy. Juanetta Gosling, M.D.    ELH/MEDQ  D:  03/14/2002  T:  03/14/2002  Job:  [id number]

## 2010-06-11 NOTE — Cardiovascular Report (Signed)
[place]. [facility]  Patient:    [name], [name] Visit Number: [id number] MRN: [id number]          Service Type: CAT Location: [id number] Attending Physician:  Pamella Pert Dictated by:   Madaline Savage, M.D. Proc. Date: 11/07/00 Admit Date:  11/07/2000   CC:         Delrae Rend, M.D., [facility]  Jonell Cluck, M.D., [place]  Cardiac Catheterization Laboratory   Cardiac Catheterization  PROCEDURES PERFORMED: 1. Selective coronary angiography by Judkins technique. 2. Retrograde left heart catheterization. 3. Left ventricular angiography. 4. Intracoronary stent deployment into the mid left circumflex obtuse marginal    branch #1.  COMPLICATIONS: None.  ENTRY SITE: Right femoral.  DYE USED: Omnipaque.  MEDICATIONS GIVEN: Fentanyl 25 IV x2, 4500 units of intravenous heparin, double bolus Integrilin and Integrilin drip.  PATIENT PROFILE: The patient is a 65 year old gentleman who has had a several month history of chest pain. He thinks he has had between 3-5 episodes of chest pain. He was admitted to [facility] overnight in late September for chest pain and had negative cardiac enzymes. He ultimately had an outpatient Cardiolite stress test showing no demonstrable ischemia while on antilipid therapy and ACE inhibitor.  Todays outpatient cardiac catheterization was scheduled by Dr. [name], who is his primary cardiologist for continued concern that this may be coronary disease.  RESULTS: On todays results the pressures were as follows:  PRESSURES: The left ventricular pressure is 120/18, central aortic pressure 120/60.  No aortic valve gradient by pullback technique.  ANGIOGRAPHIC RESULTS: The left main coronary artery was normal in appearance.  The left anterior descending artery coronary artery coursed to the cardiac apex and gave rise to 4-5 septal perforator branches. It was basically  one major diagonal branch arising between septal perforator #2 and #3. No significant lesions were noted in either diagonal or LAD, although there were luminal irregularities throughout the LAD.  The circumflex coronary artery gave rise to one very large bifurcating obtuse marginal branch that was 3 mm in diameter. The proximal circumflex contains some calcification that was faint and the vessel tapers fairly abruptly but the lumen appeared to be fairly well intact.  There was a type B1 lesion in the midportion of the obtuse marginal branch #1 that was 75% severe and was also a B1 lesion. The distal vessel did bifurcate and two 3 mm branches distally. The atrial ventricular groove portion of the circumflex was normal.  The right coronary artery was a dominant vessel that contained a diseased segment between the distal portion of the proximal vessel and virtually the entire portion of the mid right coronary artery. 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 The inflow into this aneurysm contained an area of 60% stenosis and the outflow from the aneurysm was about 40% narrowed. This diseased segment was not felt to be hemodynamically significant at the current time and was therefore not intervened upon later. The distal RCA looked like a 3.25 vessel that was definitely dominant and did not appear diseased.  The left ventricle showed good contractility in two views. Estimated ejection fraction was 55% without wall motion abnormalities.  PERCUTANEOUS INTERVENTION: This was performed using a 7 French catheter system.  The 7 French sheath was exchanged for the 6 French sheath after diagnostic cardiac catheterization was completed.  A 7 French 4 Judkins guide catheter was used without side  holes which was a good choice.  The guide wire utilized was a Nurse, learning disability which steered beautifully down to the proximal circumflex and into to the OM. The  intervention was a direct stent using a 2.75 Zeta Multi-Link stent by [employer]. During placement of the device there were ST segment elevation and some short runs of nonsustained ventricular tachycardia. Pre-dilatation of the lesion with 8-10 atmospheres of pressure yielded a central dimple in the balloon but the balloon had to be deflated in order to stop the VT and the ST elevation. It should be noted that this reproduced his typical chest pain that he had on an outpatient basis, and after balloon deflation the chest pain went away. The case was ultimately finished by recrossing the lesion with the balloon making sure it was well within the stent itself and taking it to 15 atmospheres for a minute 15.  The lesion was reduced from 75% to 0% residual and there was TIMI class 3 flow distal to the stent. ST segment was completely resolved and the patient felt well and had normal sinus rhythm and a normal blood pressure. No complications occurred other than the short-lived chest pain and nonsustained ventricular tachycardia.  FINAL DIAGNOSES: 1. Two-vessel coronary artery disease.    a. A 75% stenosis of mid obtuse marginal branch #1.    b. 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 2. Successful direct stenting of the mid obtuse marginal branch #1 with a    2.75 Multi-Link Zeta stent. 3. Well preserved left ventricular systolic function.  PLAN: The patient should continue his attempts to completely stop smoking. The Tricor should be continued. I am not sure if or why not the patient has ever been placed on a statin agent, but his LDL cholesterol was 170, and every attempt should be made to bring him down to goal. He will follow up with the excellent care of Dr. [name] in [place] and also with Dr. Nobie Putnam in [place]. Dictated by:   Madaline Savage, M.D.  Attending Physician:  Pamella Pert DD:  11/07/00 TD:   11/07/00 Job: [id number]

## 2010-06-11 NOTE — Discharge Summary (Signed)
NAMEDETRELL, UMSCHEID NO.:  [id number]   MEDICAL RECORD NO.:  [id number]          PATIENT TYPE:  INP   LOCATION:  6531                         FACILITY:  [facility]   PHYSICIAN:  Delman Cheadle, MD       DATE OF BIRTH:  27-Sep-1945   DATE OF ADMISSION:  02/15/2004  DATE OF DISCHARGE:  02/17/2004                                 DISCHARGE SUMMARY   PRIMARY CARE PHYSICIAN:  Dr. Patrica Duel.   ADMISSION DIAGNOSES:  1.  Recurrent unstable angina.  2.  History of coronary artery disease.      1.  History of catheterization October, 2002 with stent to the first          obtuse marginal artery.      2.  Status post catheterization February 09, 2004 with percutaneous          transluminal coronary angioplasty stent to the right coronary          artery.  3.  Past tobacco use, has quit since his last percutaneous coronary      intervention.  4.  Hyperlipidemia.  5.  History of transient ischemic attack.  6.  Depression.   DISCHARGE DIAGNOSES:  1.  Recurrent unstable angina.  2.  Coronary artery disease.      1.  History of catheterization October, 2002 with stent to the first          obtuse marginal artery.      2.  Status post catheterization February 05, 2004 with percutaneous          transluminal coronary angioplasty stent to the right coronary          artery.  3.  Past tobacco use, has quite since his last percutaneous coronary      intervention.  4.  Hyperlipidemia.  5.  History of transient ischemic attack.  6.  Depression.  7.  Repeat catheterization February 16, 2004 with percutaneous transluminal      coronary angioplasty stent to the left anterior descending.  8.  Mild bradycardia which will need to be followed.   HISTORY OF PRESENT ILLNESS:  Mr. Bologna is a 65 year old white male with  history of hypertension, past tobacco use, hyperlipidemia, depression,  history of transient ischemic attack in 2004. He also has a history of  coronary artery disease  status post OM-1 stent in October, 2002.  He  recently developed chest pain and was admitted to [facility] for  repeat catheterization February 09, 2004 which revealed a 99% RCA stenosis  for which he underwent PTCA stent.  At that time he did have significant  residual LAD disease as well as some other residual more moderate disease.  He was subsequently discharged home on February 10, 2004. He had no problems  until the evening of this admission. At that time he was watching a football  game when he developed substernal chest pain with associated shortness of  breath, mild nausea and diaphoresis.  He went to [employer] and checked his  blood pressure which was elevated at 180/110. He then went  to the [facility]  emergency room.  EKG there showed no significant changes. He was given  sublingual nitroglycerin x2 with relief of his chest pain. They started IV  heparin, IV nitroglycerin, and by the time of transport to [facility] he had minimal residual chest pain. At this point he was evaluated  by Dr. Lenise Herald.  At this point his blood pressure was 132/97, pulse  92.  No other significant abnormalities on physical exam. Initial point of  care markers were negative and other labs stable.  EKG showed normal sinus  rhythm and no significant ST, T change.  At this point he was seen and  evaluated by Dr. Lenise Herald. Admission was planned to a telemetry unit  for unstable angina with known residual disease of the LAD and diagonal.  He  planned to continue IV heparin, IV nitroglycerin.  He was to start tP3A if  there was recurrent chest pain or enzymes positive.  Also planned for  aggressive blood pressure control. He planned to repeat catheterization  February 16, 2004 to reassess LAD with probable intervention. The risks and  benefits of the procedure were discussed and he was agreeable to proceed.   HOSPITAL COURSE:  On the morning of February 16, 2004 he underwent   catheterization by Dr. Susa Griffins.  See the dictated report for  detail. Ultimately he performed PTCA stent to the proximal LAD. This went  from an 80 to 85% stenosis to a 0% to 10% residual. Again, see the dictated  report for all detail.  The patient tolerated the procedure well without  complications. He did use Aggrastat bolus infusion and planned to continue  the infusion 18 hours post-procedure. Again, see the dictated report for all  findings.   On the morning of February 17, 2004 Mr. Klee is doing well without any  recurrent chest pain.  Heart is in regular rhythm. His lungs are clear. His  right groin site is stable.  At this point he was seen and evaluated by Dr. Susa Griffins who deemed  him stable for discharge home.   We did review his lipid profile which does show LDL of 109. However we also  reviewed that during his recent admission of January 15th, he was just  started on Avecor at that time so we will wait for that to have effect on  his lipid profile. Dr. [name] wrote that the patient had a history of  significant impotence on his beta blocker, therefore he purposefully had put  the patient on Verapamil instead.  At this time Dr. Alanda Amass feels that the  only medication change to be made at this point would be to increase aspirin  to  A full enteric coated aspirin 325 mg.  Otherwise we will place the patient  back on the medications that were filled out on the last discharge summary.   HOSPITAL CONSULTS:  None.   PROCEDURE:  Cardiac catheterization with intervention to the LAD by Dr.  Susa Griffins on February 16, 2004.  See his dictated report for all  detail.   RADIOLOGY:  Chest x-ray on February 15, 2004 shows no acute disease.   LABORATORY DATA:  CBC on admission shows white count 9600, hemoglobin 15.0,  hematocrit 41.6, platelets 311,000.  These all remained stable through the admission. On admission electrolytes showed sodium 136, potassium  3.6, BUN  13, creatinine 1.0, glucose 97, these also remained stable and at time of  discharge home potassium  was 4.1, BUN 10, creatinine 1.0.  Cardiac enzymes  are negative with CK of 82 and 82, CK-MB 2.0 and 2.1, troponin was 0.01.  Lipid profile shows total cholesterol 190, triglyceride 201, HDL 41, LDL  109.   EKG on admission showed sinus rhythm, nonspecific ST, T change.   Telemetry on the morning of discharge currently shows some sinus bradycardia  at 53 beats per minute. However his heart rate has remained 50 or above with  no significant bradycardia.   DISCHARGE MEDICATIONS:  1.  Enteric coated aspirin 325 mg daily.  2.  Plavix 75 mg daily.  3.  Lorazepam 0.5 mg b.i.d. p.r.n.  4.  Quinapril 10 mg daily.  5.  Lexapro 20 mg daily.  6.  Protonix 40 mg daily.  7.  Imdur 30 mg daily.  8.  Avecor 20/500 mg daily.  9.  Verapamil 120 mg daily.  10. Nitroglycerin 0.4 mg sublingual as directed.   DISCHARGE INSTRUCTIONS:  1.  Continue to avoid cigarettes and smoking.  2.  No strenuous activities, no lifting over 5 pounds, sexual activity x3      days.  3.  Low cholesterol, low sodium diet.  4.  Groin site care.  5.  Call [phone] for any bleeding or pain in the groin site.   Keep your appointment to see Dr. [name] February 23, 2004 at 1530 Hr.      MBE/MEDQ  D:  02/17/2004  T:  02/17/2004  Job:  [id number]   cc:   Cristy Hilts. [name], MD  [address]  [place]  [place] [address]  Fax: [phone]   Patrica Duel, M.D.  [address], [address]  [place]  [place] [address]  Fax: [phone]

## 2010-06-11 NOTE — Discharge Summary (Signed)
NAMEHOLSTON, [name] NO.:  [id number]   MEDICAL RECORD NO.:  [id number]          PATIENT TYPE:  INP   LOCATION:  4713                         FACILITY:  [facility]   PHYSICIAN:  Vonna Kotyk R. [name], MD       DATE OF BIRTH:  17-Sep-1945   DATE OF ADMISSION:  03/17/2005  DATE OF DISCHARGE:  03/20/2005                                 DISCHARGE SUMMARY   [name] is a 65 year old male patient with a prior medical history of  coronary artery disease. He is status post bare metal stent to his OM1  October 2002, RCA DES stent February 09, 2004, and LAD DES stent February 16, 2004.  His EF was 55% at that time.  He had done well since that time,  however, he came to the emergency room with congestion and chest pain.  It  woke him up.  He went to [facility] ER.  His first enzymes were  positive and he was transferred here for further evaluation.  He was placed  on IV nitroglycerin, heparin and a 2b3a inhibitor.  He was pain-free when he  was transferred to [facility], thus it was decided to  wait until the following day for cath.  On March 18, 2005, he underwent  cardiac catheterization by Dr. Lenise Herald.  His EF was 30 to 35%.  He  had global hypokinesis.  OM1 mid he had a 95% stenosis.  He underwent CYPHER  stenting 2.5 x 18 reduced from 95 to less than 10%.  He was put on the  CHAMPION trial.  Dr. Jenne Campus though he needed Plavix indefinitely.  The  following day, he was doing fairly well.  He had no further chest pain.  His  medications were titrated and on March 20, 2005, he was seen by Dr. [name]  with plans for discharge home.  His blood pressure is 95/50, his temperature  is 98.5, his heart rate was 61.   LABORATORY DATA:  Hemoglobin was 12.5, hematocrit 34.7, wbc 9, platelets  229.  His sodium was 137, potassium 3.7, chloride 105, CO2 26, BUN 8,  creatinine 1.1, glucose 99.  SGOT was 32, SGPT 22, albumin 3.2, total  protein 6.  CK-MB  #1 327/22, troponin 7.47; #2 263/17.2, troponin 10.68; #3  246/12/7; #4 213/7.7; #5 161/5.  This total cholesterol was 203, his  triglycerides 445, HDL 24, LDL not calculated.   X-ray showed bibasilar atelectasis.   DISCHARGE MEDICATIONS:  1.  Metoprolol 25 mg twice a day.  2.  Zocor 40 mg at bedtime.  3.  Norvasc 5 mg every day.  4.  Lexapro 20 mg every day.  5.  Plavix 75 mg every day.  6.  Lisinopril 10 mg a day.  7.  Niaspan 500 mg at bedtime.  8.  Aspirin 325 mg.  9.  Zantac 300 mg a day.  10. Digoxin 0.125 mg a day.  11. Omacor two every day or Mega III fatty acids, if he takes over-the-      counter, he should take three  or four.   He will return to see Dr. [name] in two weeks.  He will call us for an  appointment.  He should do no strenuous activity, lifting, pulling or  pushing for four days and to wait to play golf until he is seen by Dr.  [name].   DISCHARGE DIAGNOSES:  1.  Non-ST segment elevation myocardial infarction.  2.  Progressive coronary disease, status post cardiac catheterization with      new high grade blockage to his first obtuse marginal.  He also has      diffuse vessel disease distally of his vessels, all about 50% stenosis.  3.  Ischemic cardiomyopathy, ejection fraction at the time of cath was 30 to      35%, previously 55%.  He had global hypokinesis possibly a thin      myocardium having an myocardial infarction.  4.  Tobacco smoking.  5.  Dyslipidemia.  6.  Hypertension.  7.  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  8.  Chronic obstructive pulmonary disease, smoker.  9.  History of transient ischemic attack in 2004.      Lezlie Octave, N.P.      Cristy Hilts. [name], MD  Electronically Signed    BB/MEDQ  D:  03/20/2005  T:  03/21/2005  Job:  [id number]   cc:   Patrica Duel, M.D.  Fax: [phone]

## 2010-06-11 NOTE — Discharge Summary (Signed)
[place]. [facility]  Patient:    [name], [name] Visit Number: [id number] MRN: [id number]          Service Type: MED Location: [id number] Attending Physician:  Pamella Pert Dictated by:   Raymon Mutton, P.A. Admit Date:  10/14/2000 Discharge Date: 10/15/2000   CC:         [name], M.D.   Discharge Summary  DATE OF BIRTH:  1945-03-20.  DISCHARGE DIAGNOSES: 1. Chest pain, rule out ischemia - etiology undetermined, resolved. 2. Hyperlipidemia and hypercholesterolemia. 3. Gastroesophageal reflux disease. 4. Depression.  HOSPITAL CONSULTS:  None.  HOSPITAL PROCEDURES:  None.  HOSPITAL COMPLICATIONS:  None.  HISTORY OF PRESENT ILLNESS:  [name] is a 65 year old Caucasian gentleman who was seen in the emergency department at [facility] the morning of his admission and was complaining of chest pain and pressure and was transferred to [facility] for evaluation.  The first time he was seen at the [facility] Emergency Department with severe chest pain and pressure on October 11, 2000, and they said that it occurred after he was practicing golf. At that time at the emergency department he was diagnosed with pneumonia and started on Septra 800/160 b.i.d.  On October 14, 2000, he went to work, was sitting at work when he experienced another severe episode of chest pain and somewhat crushing sensation his chest with radiation to the base of his neck bilaterally and left arm.  He also had been nauseated and experienced some lightheadedness. He denied diaphoresis or shortness of breath.  The pain scared him because he never experienced such intense pain before and he came to the emergency department at [facility] for an evaluation.  PAST MEDICAL HISTORY:  Depression, gastroesophageal reflux disease, status post cholecystectomy, status post right hip joint bursitis, status  post negative colonoscopy but he was diagnosed as having internal hemorrhoids and complaints of bleeding with bowel movements.  He also underwent bilateral rotator cuff tear repairs.  FAMILY HISTORY:  Positive for coronary artery disease.  Mother had congestive heart failure and some other heart problems and died at age 63.  His father died at age 52 and he had been diagnosed with CVA.  There is no history of diabetes.  HOSPITAL COURSE:  He was admitted to [facility] telemetry unit, started on Lovenox 85 mg mg subcu q.12h., aspirin 325 mg and Accupril 10 mg daily.  We also started him on Protonix 40 mg daily.  We checked his cardiac enzymes, his lipid panel, BMP.  EKG was ordered and chest x-ray.  EKG showed sinus bradycardia and no acute STT wave changes.  Telemetry showed that he maintained sinus rhythm and heart rate was between 56 and 60.  His blood work showed negative CK and CK-MB was borderline.  Troponin 0.05 but this decreased to 0.4 in the second set of enzymes.  Hemoglobin was 15.1 and hematocrit 40.3.  Potassium was 3.8, sodium 136, BUN 9, and creatinine 1.0, glucose 92.  Lipid panel was abnormal showing cholesterol 234, triglycerides 700 and HDL 33, LDL was not calculated.  His physical examination was benign. Blood pressure 118/76, pulse 66, Oxygen saturation was 97% on two liters. Lungs clear to auscultation bilaterally.  Heart was in regular rate and rhythm without any murmurs, rubs, or gallops.  Abdomen was soft, nontender and nondistended. There were positive bowel sounds x 4 quadrants.  Extremities did not  reveal any edema.  The next morning he was reassessed by Delrae Rend, M.D., and the plan was to discharge the patient and follow-up with outpatient Cardiolite to assess the myocardia perfusion and systolic function. He was started on proton pump inhibitors and on Tricor 160 to decrease triglyceride level.  DISCHARGE MEDICATIONS: 1. Celexa  40 mg q.d. 2. Protonix 40 mg q.d. 3. Tricor 160 mg q.d. 4. Nitroglycerin 0.4 mg sublingual.  He was instructed to take up to three    pills within 15 minutes and if pain was not relieved, call the ambulance    or come to emergency room. 5. Accupril 10 mg daily. 6. Enteric coated aspirin 81 mg daily.  DISCHARGE ACTIVITY:  As tolerated.  DISCHARGE DIET:  Low fat, low cholesterol, low salt diet.  OTHER DISCHARGE INSTRUCTIONS:  Discontinue antibiotic since his chest x-ray did not show any active lung disease.  DISCHARGE FOLLOW-UP:  Our office will call patient to arrange outpatient stress test and after this test, he will be reevaluated by Dr. [name].Dictated by:   Raymon Mutton, P.A. Attending Physician:  Pamella Pert DD:  10/26/00 TD:  10/27/00 Job: [id number]

## 2010-06-11 NOTE — H&P (Signed)
NAME:  CHARES, [name]                         ACCOUNT NO.:  [id number]   MEDICAL RECORD NO.:  [id number]                   PATIENT TYPE:  INP   LOCATION:  A213                                 FACILITY:  [facility]   PHYSICIAN:  Gracelyn Nurse, M.D.              DATE OF BIRTH:  05-Dec-1945   DATE OF ADMISSION:  09/09/2001  DATE OF DISCHARGE:                                HISTORY & PHYSICAL   CHIEF COMPLAINT:  Diaphoresis, nausea, and dizziness.   HISTORY OF PRESENT ILLNESS:  This is a 65 year old white male with known  coronary artery disease having a heart cath with stents placed in 2002.  He  was sitting at work today when he had a sudden onset of profuse diaphoresis,  he felt dizzy, weak to the point that he had to lie on the floor and became  nauseated.  He had some mild shortness of breath but did not complain of any  chest pain.   PAST MEDICAL HISTORY:  1. Coronary artery disease.     a. 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  2. Hyperlipidemia.  3. GERD.  4. Depression.  5. Tobacco abuse.  6. Chronic headache.     a. Status post nerve block.  7. Status post cholecystectomy.  8. Status post rotator surgery x2.   ALLERGIES:  No known drug allergies.   CURRENT MEDICATIONS:  1. Enteric-coated aspirin 325 mg q.d.  2. Celexa 40 mg q.d.  3. Tricor 60 mg q.d.  4. Protonix 40 mg q.d.  5. Accupril 10 mg q.d.  6. Zocor 20 mg q.d.  7. Wellbutrin 150 mg b.i.d.  8. Vioxx 25 mg q.d.   SOCIAL HISTORY:  He smokes 1-1/2 packs of cigarettes a day, drinks alcohol  occasionally.  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   FAMILY HISTORY:  Mother died at age 30 of congestive heart failure.  Father  died at age 20 from a stroke.  He does have one brother with hypertension.   REVIEW OF SYSTEMS:  As  per HPI.  All other systems are reviewed and are  normal.   PHYSICAL EXAMINATION:  VITAL SIGNS:  Temperature 97, pulse 62, respirations  20, blood pressure 148/98.  GENERAL:  A well-nourished white male who is diaphoretic.  HEENT:  Pupils equal, round, reacting to light. Extraocular movement intact.  Oral mucosa is moist.  CARDIOVASCULAR:  Regular rate and rhythm, no murmurs.  LUNGS:  Clear to auscultation.  ABDOMEN:  Soft, nontender, nondistended.  Bowel sounds are positive.  EXTREMITIES:  No edema.  NEUROLOGICAL:  Cranial nerves II-XII grossly intact.  No focal deficits.  Strength is 5/5 bilateral.  SKIN:  Moist with no rash.   ADMITTING LABORATORY DATA:  CK is 376 with an MB fraction of 5.3, troponin-I  is 0.01.  Sodium 137, potassium 3.3, chloride 109, CO2 28, BUN 11,  creatinine 1.2, glucose 114.  White blood cells 8.4, hemoglobin 12.8,  platelets 298.   DIAGNOSTIC DATA:  EKG shows sinus bradycardia with a rate of 56, some mild  ST elevation in V2 and V3 but appears to be less than one box.   ASSESSMENT AND PLAN:  1. Sign and symptom complex of diaphoresis, nausea and dizziness.  The     patient had no chest pain but, however, this could be an anginal     equivalent especially given his history of coronary artery disease.  I     feel it is prudent to go ahead and admit the patient to rule out an acute     myocardial infarction, to start him on anticoagulation, beta blocker and     aspirin and p.r.n. nitroglycerin.  He is already on a statin.  Will     continue that.  If he rules out, I believe he still needs to be stress     tested with a Cardiolite.  He is currently seen by [facility]     Cardiology, so we will ask for a consult in the morning to set him up for     stress testing. If he does rule in, then we will make arrangements to     transfer to [facility] for a possible heart cath.  2. Hyperlipidemia--will continue his Zocor.  3. Gastroesophageal reflux disease--will  continue his Protonix.  4. All other medical problems--we will continue to treat with home     medications.                                               Gracelyn Nurse, M.D.    JDJ/MEDQ  D:  09/09/2001  T:  09/10/2001  Job:  [id number]   cc:   Jonell Cluck, M.D.  [address], [address]  [place]  [place] [address]  Fax: [phone]   Cristy Hilts. [name], M.D.   Rene Kocher, M.D.

## 2010-06-11 NOTE — Discharge Summary (Signed)
   NAME:  [name], [name]                         ACCOUNT NO.:  [id number]   MEDICAL RECORD NO.:  [id number]                   PATIENT TYPE:  INP   LOCATION:  IC04                                 FACILITY:  [facility]   PHYSICIAN:  Madelin Rear. Sherwood Gambler, M.D.             DATE OF BIRTH:  [birth year]   DATE OF ADMISSION:  03/05/2002  DATE OF DISCHARGE:  03/05/2002                                 DISCHARGE SUMMARY   DISCHARGE DIAGNOSES:  1. Chest pain.  2. Coronary artery disease.  3. Gastroesophageal reflux disease.  4. Depression.   SUMMARY:  The patient was admitted with a known history of coronary disease,  presented with chest pain.  He had no associated cardiopulmonary symptoms.  Pain was relieved with 3 nitroglycerine.  He was admitted for rule out  myocardial infarction, although he remained chest pain free at the time of  admission.  Cardiac enzymes were negative.  He was discharged home  asymptomatic to follow up outpatient with cardiology.                                               Madelin Rear. Sherwood Gambler, M.D.    LJF/MEDQ  D:  04/05/2002  T:  04/05/2002  Job:  [id number]

## 2010-06-11 NOTE — Op Note (Signed)
NAME:  [name], [name]               ACCOUNT NO.:  [id number]   MEDICAL RECORD NO.:  [id number]          PATIENT TYPE:  OIB   LOCATION:  3014                         FACILITY:  [facility]   PHYSICIAN:  Reinaldo Meeker, M.D. DATE OF BIRTH:  06-10-1945   DATE OF PROCEDURE:  08/20/2004  DATE OF DISCHARGE:                                 OPERATIVE REPORT   PREOPERATIVE DIAGNOSIS:  Herniated disc L4-5 left, extraforaminal.   POSTOPERATIVE DIAGNOSIS:  Herniated disc L4-5 left, extraforaminal.   PROCEDURE:  Left L4-5 extraforaminal microdiscectomy.   SURGEON:  Reinaldo Meeker, M.D.   ASSISTANT:  Izell Archer. Deaton, M.D.   DESCRIPTION OF PROCEDURE:  After being placed in the prone position,  patient's back was prepped and draped in the usual sterile fashion.  Localizing x-ray was taken prior to incision to identify the appropriate  level.  Midline incision was made above the spinous processes of L4 to L5.  Using the Bovie cutting current, the incision was carried down the spinous  processes.  Subperiosteal dissection was then carried out along the left  side of the spinous processes and lamina facet joint down to the transverse  process of L4.  Self-retaining retractor was placed for exposure and x-ray  showed approach of the appropriate level. Lateral one third of the facet  joint was then removed to allow access of the foraminal compartment.  Inferior edge of the transverse process was also removed.  Microscope was  brought in the field and used through the remainder of the case.  Using  microdissection technique, progressive deeper dissection was carried out.  X-  ray was taken which showed slightly superior approach and therefore,  dissection was carried out slightly inferior and slightly more medial by  removing a bit more of the lateral facet joint.  At this time, the L4 nerve  root could be identified.  Medial to it, pushing lateral was a large  foraminal disc herniation.  Disc was  coagulated and incised with a #15  blade.  Using pituitary rongeurs and curettes, thorough disc space clean out  was carried out with great care to well decompress the L4 nerve root.  At  this point, there was no evidence of further disc herniation in the  extraforaminal compartment.  The nerve root was well decompressed.  Large  amounts of irrigation were carried out at this time and any bleeding  controlled with bipolar coagulation and Gelfoam. Wound was then closed in  multiple layers of Vicryl in the muscle fascia, subcutaneous and  subcuticular tissues and staples on the skin.  A sterile dressing was then  applied.  The patient was extubated and taken to the recovery room in stable  condition.       ROK/MEDQ  D:  08/20/2004  T:  08/20/2004  Job:  [id number]

## 2010-06-11 NOTE — Discharge Summary (Signed)
   NAME:  [name], [name]                         ACCOUNT NO.:  [id number]   MEDICAL RECORD NO.:  [id number]                   PATIENT TYPE:  INP   LOCATION:  A213                                 FACILITY:  [facility]   PHYSICIAN:  Corrie Mckusick, M.D.               DATE OF BIRTH:  [birth year]   DATE OF ADMISSION:  09/09/2001  DATE OF DISCHARGE:  09/11/2001                                 DISCHARGE SUMMARY   HISTORY:  For history of presenting illness and past medical history please  see admission H&P.   HOSPITAL COURSE:  A 65 year old gentleman with known coronary artery disease  who presented with complex symptomatology including diaphoresis, nausea, and  dizziness.  He felt like this could be his anginal equivalent and therefore  he was admitted for rule out protocol.  Please see admission H&P for  details.   The day after admission the patient felt well with no complaint; vital signs  were stable.  CPK and troponins were negative at that point.  Cardiology saw  the patient that day.  Dr. Landry Dyke recommendation was ultimately he needed a  Cardiolite stress test.   I saw the patient on September 11, 2001; the patient wanted to go home.  Vital  signs remained stable other than some bradycardia.  Beta blocker was held.  Dr. Domingo Sep was to see the patient prior to discharge.  She was to set up  outpatient carotid studies and felt like he was able for discharge home.   DISCHARGE PHYSICAL EXAMINATION:  Please see note from September 11, 2001.   DISCHARGE MEDICATIONS:  Per Dr. Domingo Sep was no change in medicines.   FOLLOW-UP:  With Dr. Nobie Putnam next week.  Follow up with Dr. Domingo Sep on the  following week.                                               Corrie Mckusick, M.D.    JCG/MEDQ  D:  10/22/2001  T:  10/23/2001  Job:  [id number]
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
## 2010-06-11 NOTE — Cardiovascular Report (Signed)
[name], [name]               ACCOUNT NO.:  [id number]   MEDICAL RECORD NO.:  [id number]          PATIENT TYPE:  INP   LOCATION:  3707                         FACILITY:  [facility]   PHYSICIAN:  [name], M.D.DATE OF BIRTH:  09-Mar-1945   DATE OF PROCEDURE:  02/16/2004  DATE OF DISCHARGE:                              CARDIAC CATHETERIZATION   PROCEDURES:  1.  Central aortic catheterization.  2.  Selective coronary angiography pre and post intracoronary nitroglycerin      administration.  3.  Left ventricular angiogram RAO/LAO projection.  4.  Subselective left internal mammary artery/right internal mammary artery.  5.  Abdominal aortic angiogram mid stream pulmonary artery projection.  6.  Weight adjusted heparin.  7.  IVUS interrogation left anterior descending.  8.  Cutting balloon atherectomy and subsequent long DES stent high pressure      deployment proximal left anterior descending.  9.  Double bolus Aggrastat plus infusion.  10. Plavix 150 extra, continued aspirin.   BRIEF HISTORY:  [name] is a 65 year old white separated father of three  children and two grandchildren, past heavy smoker one and a half packs a day  for 30 years, occasional to mild tobacco continued abuse last several years,  works at [employer] full-time, is active and his son lives with  him.   He has a history of coronary disease, hyperlipidemia, and remote TIA in  2004.  He underwent culprit lesion bare metal stenting with a 2.75 Multilink  of a large obtuse marginal branch of the circumflex November 07, 2000.  Presented with unstable angina and underwent DES Cypher stenting with a 3.0  x 13 stent on February 09, 2003 to the proximal/mid RCA.  He had associated  LAD disease and was to be followed medically and considered for staged  intervention.  In the interim he was admitted to [facility] after an episode of  prolonged chest pain compatible with ischemia on February 15, 2004.  Myocardial infarction was ruled out with serial enzymes and EKGs.  Patient  was referred for recatheterization in this setting.  Heparin was held on-  call to the laboratory and patient was premedicated with 5 mg Valium p.o.  He was brought to the second floor CP Laboratory in the postabsorptive  state.  The right groin was prepped/draped in the usual manner.  1%  Xylocaine was used for local anesthesia.  The CFRA was entered with a single  anterior puncture using 18 thin wall needle with modified Seldinger  technique.  He was given 2 mg of Nubain and 2 mg of Versed for sedation.  Diagnostic coronary angiography was done with a 6-French 4 cm taper  preformed Cordis coronary and pigtail catheters.  Subselective LIMA and RIMA  was done with the right coronary catheter and hand injection.  Abdominal LV  angiogram was done with a pigtail catheter 25 mL 14 mL/second, 20 mL 12  mL/second.  Pullback pressure of the PA was performed that showed no  gradient across the aortic valve.  Abdominal aortic angiogram was done in  the midstream PA projection at 25  mL 20 mL/second with visualization to the  SFA profunda junction bilaterally.  Catheter was removed.  Sidearm sheath  was flushed.  Pending review of the patient's cineangiograms.  He tolerated  the diagnostic procedure well.   PRESSURES:  LV:  140/0.  LVEDP 18/20 mmHg.   CA:  140/80 mmHg.   There was no gradient across the aortic valve on catheter pullback.   LV angiogram demonstrated mild hypokinesis of the mid anterolateral and  septal wall.  There was minimal hypokinesis of the distal inferior wall.  Overall EF was approximately 55%.  No significant mitral regurgitation.   The RIMA was patent.  There was no brachiocephalic stenosis.  There was  calcific 30% left subclavian stenosis with good flow and no gradient and the  LIMA was patent.  Both vertebrals were antegrade.   Abdominal angiogram demonstrated calcific left renal artery  stenosis of  approximately 70% angiographically.  There may have been 40-50% right renal  artery stenosis but there was good flow and I could not see the ostia in the  plane PA view.  The infrarenal abdominal aorta had moderate atherosclerotic  disease.  The IMA, celiac, and SMA were intact.  No significant iliac  disease.  The hypogastrics were intact and SFA profunda junction was intact  bilaterally.   Fluoroscopy showed 2-3+ calcification segmentally in the proximal LAD and  right coronary artery.   The main left coronary was large and normal with no significant stenosis.   The LAD had stenosis beyond the ostia of approximately 80-85%, eccentric,  segmental involving the thin, moderate sized first diagonal branch that  bifurcated.  The first diagonal had 70% proximal stenosis segmentally.   There was an intercurrent area without significant stenosis of the LAD, but  a second tandem area of 85% eccentric stenosis, calcific in the proximal LAD  ending before the second septal perforator and the large DX2.  The DX2 had  60% proximal and ostial disease with good flow.  There was diffuse disease  in the LAD with 40-50% mid and 70-80% distal, but good flow to the apex  where it bifurcated.  There was a large septal perforator that bifurcated  arising from the eccentric proximal LAD disease.   The circumflex had 50-60% smooth eccentric stenosis in the proximal third.  The large first marginal branch had 0% to 10% narrowing in the previously  placed 2.75 Multilink stent from October 2002 with good flow to the two  large bifurcation branches.  The circumflex proper had 60-70% stenosis just  beyond the OM with good flow.  The PABG branch had no significant stenosis  and the distal two circumflex branches had no significant stenosis.   The right was a dominant vessel.  There was 40-50% smooth eccentric narrowing around the proximal bend.  The previously placed 3.0 x 13 Cypher  stent in the  aneurysmal area of high grade stenosis (February 09, 2003)  looked excellent with 0% narrowing and good flow.  There was residual  disease in the right with 50% segmental disease in the mid portion beyond  the RV branch, another tandem 50% beyond the acute margin and before the  distal bifurcation, and 50-60% proximal PLA, another 70-80 mid to distal  third of the PLA, good flow throughout, however.   The patient has high grade eccentric calcific disease of the LAD which is  probably his culprit.  We proceeded with IVUS interrogation and possible  planned PCI depending on these results.  Informed  consent was obtained to  proceed.   He was given weight adjusted heparin.  ACTs were monitored.  A total of 5000  units of heparin were given, another 150 of Plavix.  With therapeutic ACT  the left coronary was intubated with a JL4 6-French Cordis guiding catheter.  A proximal LAD lesion was crossed with a 0.014 Asahi soft wire which was  positioned free in the distal LAD.  An Atlantis Pro IVUS was then crossed  into the mid portion of the LAD and pullback was performed.   The IVUS pullback was done to the main left.  The distal reference vessel  beyond the lesions measured 2.7 x 2.9.  There was rather diffuse moderate  coronary disease around the DX2, but no high grade stenosis.   The tandem LAD stenoses were high grade, eccentric, calcific, and measured  approximately 1.5 x 2.2 mm in both areas.  The proximal reference was 3.2  mm.  The LAD lesion ended before the main left and circumflex bifurcation,  but did involve DX1.   It was elected to proceed with PCI in this setting.  We felt we might have  to sacrifice the DX1 because of its very proximal nature and diffuse  disease.   Cutting balloon atherectomy was performed in the LAD in two overlapping  areas with a 2.5 x 10 cutting balloon dilated at 4-5 atmospheres.  The  balloon was then pulled back.  A 3.0 x 24 long TAXUS Express-II  Scimed DES  stent was then positioned fluoroscopically and deployed at 14 atmospheres  for 29 seconds.  It was redilated to 15 atmospheres for 34 seconds.  It was  deflated, balloon was pulled back, and final injections were performed in  multiple orthogonal projections showing excellent angiographic result with  stenosis reduction of 85% to essentially 0 and 10% in the mid portion.  There was excellent TIMI 3 flow to the distal LAD and to the DX2.  There was  flow to the moderately large septal perforator arising from the mid portion  of the lesion and there was flow to the DX1, although there appeared to be  an ostial pinch of 80-90%.  We did not try to recross the DX1.  The patient  was stable.  There was no EKG changes or chest pain.  The dilatation was  removed.  Sidearm sheath was flushed and secured with #1 silk suture to  prevent migration.  The patient was transferred to the holding area for postoperative care.  We plan to continue 2B3A inhibitor for approximately 18  hours.  Continue aspirin and Plavix.  He still smokes a little bit, needs to  discontinue and he needs vigorous lipid management.  The patient has diffuse  disease, three vessel.  He has had successful recent interventions of his  RCA (February 09, 2003), and today his LAD, and remotely his circumflex.  These were culprit lesion interventions for high grade symptomatic stenosis.  If he has diffuse progression of disease he may eventually require multi  vessel CABG.   CATHETERIZATION DIAGNOSES:  1.  Arteriosclerotic heart disease, diffuse three vessel coronary artery      disease.      1.  Remote CXOM non DES 2.75 Multilink stent November 07, 2000 widely          patent on this study.      2.  Prior mid right coronary artery DES stent 3.0 x 13 Cypher (February 09, 2003) widely patent on this study.      3.  High grade eccentric calcific segmental proximal left anterior          descending stenosis treated  with cutting balloon atherectomy and          subsequent long DES stenting under IVUS interrogation February 16, 2004.  Good angiographic result with patent side branches.      4.  Well preserved left ventricular function, ejection fraction 55%.  2.  50-70% left renal artery stenosis to be followed up with normal renal      function.  3.  Hyperlipidemia on therapy.  4.  Cigarette abuse, chronic, significantly reduced.  5.  Chronic obstructive pulmonary disease, chronic bronchitis.      RAW/MEDQ  D:  02/16/2004  T:  02/16/2004  Job:  [id number]   cc:   CP Lab   Cristy Hilts. [name], MD  [address]  [place]  [place] [address]  Fax: [phone]   Patrica Duel, M.D.  [address]  [place]  [place] [address]  Fax: [phone]

## 2010-06-11 NOTE — Procedures (Signed)
NAME:  [name], [name] NO.:  [id number]   MEDICAL RECORD NO.:  [id number]                   PATIENT TYPE:  POUT   LOCATION:                                       FACILITY:   PHYSICIAN:  Kem Boroughs, M.D.                 DATE OF BIRTH:   DATE OF PROCEDURE:  06/20/2002  DATE OF DISCHARGE:                                  ECHOCARDIOGRAM   REFERRING PHYSICIANS:  Patrica Duel, M.D. and Cristy Hilts. [name], M.D.   PROCEDURE:  Echocardiogram   SURGEON:  Sherral Hammers, M.D.   INDICATIONS:  [name] is a 65 year old gentlemen with known CAD who was  admitted with vision changes.   The technical quality of this study is reasonable.   FINDINGS:  1. The aorta is within normal limits at 3.6 cm.  2. The left atrium is mildly dilated at 4.5 cm. No obvious clots or masses     were appreciated and the patient appeared to be in sinus rhythm during     this procedure.  3. The interventricular septum is mild-to-moderately thickened at 1.7 cm;     while the posterior wall is mildly thickened at 1.2-1.3 cm.  4. The aortic valve appears structurally normal, trileaflet, and pliable     with good leaflet excursion.  No aortic insufficiency is noted.  Doppler     interrogation of the aortic valve is within normal limits.  5. The mitral valve also appears structurally normal.  Leaflet excursion is     normal.  No mitral valve prolapse is noted.  Doppler interrogation of the     mitral valve is within normal limits.  6. The pulmonic valve is incompletely visualized.  7. The tricuspid valve also appears grossly structurally normal with trivial     tricuspid regurgitation noted.  8. The left ventricular is normal in size with preserved overall left     ventricular systolic function which is probably at the lower limits of     normal.  There is a suggestion of quite mild hypokinesis in the inferior     wall.  Mild left ventricular relaxation abnormality is noted.  The  right     atrium is normal in size.  The right ventricle is mildly generous, but     with preserved right ventricular systolic function.   IMPRESSION:  1. Mild left atrial enlargement.  2. Mild-to-moderate asymmetric septal hypertrophy as well as mild posterior     wall thickening.  3. Mild mitral regurgitation.  4. Trivial tricuspid regurgitation.  5. Overall left ventricular systolic function appears preserved and is     probably at the lower limits of normal.  6. Left ventricular chamber size is normal.  7. The presence of diastolic dysfunction is inferred from pulse wave Doppler     across the mitral valve.  8. There is a suggestion of  quite mild inferior hypokinesis.  9. The right ventricle appears mildly generous but with normal right     ventricular systolic function.  10.      No obvious source of emboli is appreciated; however, you may wish     to pursue transesophageal echocardiogram should this clinical suspicion     still persist particularly given the enhanced sensitivity and specificity     in this regard with transesophageal echocardiogram.                                               Kem Boroughs, M.D.    TB/MEDQ  D:  06/20/2002  T:  06/20/2002  Job:  [id number]   cc:   Patrica Duel, M.D.  [address]  [place]  [place] [address]  Fax: [phone]   Cristy Hilts. [name], M.D.  [address]  [place]  [place] [address]  Fax: [phone]

## 2010-06-11 NOTE — Op Note (Signed)
NAMERICKI, CLACK NO.:  [id number]   MEDICAL RECORD NO.:  [id number]          PATIENT TYPE:  INP   LOCATION:  6522                         FACILITY:  [facility]   PHYSICIAN:  Darlin Priestly, MD  DATE OF BIRTH:  1945-07-25   DATE OF PROCEDURE:  03/18/2005  DATE OF DISCHARGE:                                 OPERATIVE REPORT   PROCEDURE:  1.  Left heart catheterization.  2.  Coronary angiography.  3.  Left ventriculogram.  4.  Abdominal aortogram.  5.  OM1 - mid      1.  Percutaneous transluminal coronary angioplasty.      2.  Placement of coronary stent.   ATTENDING:  Darlin Priestly, MD   COMPLICATIONS:  None.   INDICATIONS FOR PROCEDURE:  Mr. Reader is a 65 year old male and patient of  Dr. Patrica Duel and Dr. Yates Decamp with a history of ongoing tobacco abuse,  history of coronary artery disease status post non-DES stent placement in  October 2002 of the obtuse marginal. Patient on a repeat catheterization in  January 2006 with a placement of a drug-eluting stent in the mid RCA as well  as a placement of a drug-eluting stent in the proximal LAD. EF at the time  was normal. He was readmitted on March 17, 2005 to [facility]  with recurrent angina and subsequently ruled in for non-Q-wave MI. He is now  brought for repeat catheterization to reassess his coronary anatomy.   DESCRIPTION OF PROCEDURE:  After obtaining informed written consent, patient  was brought to the cardiac catheterization laboratory. Right groin was  shaved, prepped and draped in the usual sterile fashion. ECG monitor was  established.  Using modified Seldinger technique, a #6 French sheath was  introduced into the right femoral artery.  A #6 French diagnostic catheter  was used to perform diagnostic angiography.   Left main is a large vessel with no significant disease.   LAD is a medium size vessel which courses to the apex and gives rise to one  diagonal branch.  LAD is noted to have a stent in its proximal segment, which  appears widely patent. The remainder of the mid LAD has diffuse 50% stenotic  lesions. The LAD becomes a smaller vessel towards the apex with a 70% apical  lesion.   First diagonal was a small-to-medium size vessel, which was irregular but  has no high grade stenosis.   Left circumflex is a medium size vessel which courses to the AV groove and  gives rise to two obtuse marginal branches. The AV groove circumflex has 30%  proximal and 50% mid disease.   First OM is a medium-to-large size vessel which bifurcates into its digital  segment and noted to have a stent in its distal mid portion. There is a 95%  lesion prior to the previously-placed stent with a 30% distal in-stent  restenosis noted.   The second OM is a small diffusely-diseased vessel up to 50%.   There are faint left-to-right collaterals to the distal PDA.   Right coronary artery is a  medium size vessel which is tortuous and gives  rise to PDA and  posterolateral branch. There is a stent noted in the mid  portion of the RCA. There is 60% proximal RCA disease. The stent appears to  be patent. There is scattered 50% to 60% disease throughout the mid and  distal portions of the RCA. There is 60% proximal lesion in the PDA with a  70% lesion in the distal mid PLA.   Left ventriculogram revealed a moderate-to-severely depressed EF of  approximately 30% to 35% with global hypokinesis.   Abdominal aortogram reveals no evidence of significant renal artery  stenosis.   HEMODYNAMICS:  Systemic arterial pressure 130/77, LV systemic pressure  135/7, LVP of 15.   INTERVENTIONAL PROCEDURE:  OM1-mid:  Following diagnostic angiography, a #6  French XB3.5-guiding catheter with sidehole ___________ engaged in the left  coronary ostium.  Next a 0.014 ATW marker wire was advanced out of the  guiding catheter and positioned in the distal first OM without difficulty.   Measurements were obtained. Next, a Cypher 2.5 x 18 mm stent was then  positioned in the mid first OM with careful attention to overlap into the  previously-placed stent. The stent was deployed __________ for a total of 26  seconds.  A second inflation to 16 atmospheres was performed for a total of  30 sec. Followup angiogram revealed excellent luminal gain with no evidence  of dissection or thrombus. This balloon was removed and then a power cell  2.75 x 8 mm balloon was then advanced into the distal segment of the stent  to dilate in the overlap segment. One inflation at 10 atmospheres performed  for a total of 27 seconds. Follow-up angiogram revealed no evidence of  dissection or thrombus with TIMI 3 flow to the distal vessel. IV Integrilin  was used throughout the case. Intravenous doses of Heparin were given to  maintain ACT between 200 and 300. Patient was then enrolled in the CHAMPION  trial to evaluate IV cangrelor versus Plavix.   Final orthogonal angiograms revealed less than 10% residual stenosis in the  first OM with TIMI 3 flow to the distal vessel. At this point, we concluded  the procedure. All balloon __________ Hemostatic sheath was sewn into place  and patient returned back to ward in stable condition.   CONCLUSION:  1.  Successful placement of a Cypher 2.5 x 18 mm stent in the mid OM-1      stenotic lesion ultimately postdilated to 2.75 mm.  2.  Significant 2 vessel coronary artery disease with small vessel disease.  3.  Moderate to severely depressed left ventricular systolic function.  4.  Adjuvant use of Integrilin infusion.  5.  Enrollment in CHAMPION study and investigating intravenous cangrelor      versus Plavix.      Darlin Priestly, MD  Electronically Signed     RHM/MEDQ  D:  03/18/2005  T:  03/18/2005  Job:  [id number]   cc:   Madaline Savage, M.D.  Fax: [phone]   Patrica Duel, M.D.  Fax: [phone]   Cristy Hilts. [name], MD Fax: [phone]

## 2010-06-11 NOTE — Procedures (Signed)
   NAME:  [name], [name]                         ACCOUNT NO.:  [id number]   MEDICAL RECORD NO.:  [id number]                   PATIENT TYPE:  INP   LOCATION:  A213                                 FACILITY:  [facility]   PHYSICIAN:  Fredirick Maudlin, M.D.              DATE OF BIRTH:  [birth year]   DATE OF PROCEDURE:  09/09/2001  DATE OF DISCHARGE:                                EKG INTERPRETATION   The rhythm is a sinus rhythm with a bradycardic rate in the 50s.  There are  Q-waves in limb lead 3 and a smaller T-wave in aVF.  The R-wave is rather  tall in V1 and V2.  This could indicate inferior and partial posterior  extension.  None of this appears to be acute at this time.  Abnormal  electrocardiogram.                                               Fredirick Maudlin, M.D.    ELH/MEDQ  D:  09/10/2001  T:  09/11/2001  Job:  [id number]

## 2010-10-22 LAB — POCT I-STAT 4, (NA,K, GLUC, HGB,HCT)
Glucose, Bld: 79
Glucose, Bld: 83
HCT: 31 — ABNORMAL LOW
HCT: 33 — ABNORMAL LOW
Hemoglobin: 10.5 — ABNORMAL LOW
Hemoglobin: 11.2 — ABNORMAL LOW
Hemoglobin: 6.8 — CL
Hemoglobin: 8.2 — ABNORMAL LOW
Potassium: 4.2
Potassium: 5
Potassium: 5.3 — ABNORMAL HIGH
Sodium: 134 — ABNORMAL LOW
Sodium: 137

## 2010-10-22 LAB — CBC
HCT: 30.6 — ABNORMAL LOW
HCT: 38.8 — ABNORMAL LOW
HCT: 39.5
HCT: 41.9
HCT: 22.6 — ABNORMAL LOW
HCT: 25.2 — ABNORMAL LOW
HCT: 25.7 — ABNORMAL LOW
Hemoglobin: 13.2
Hemoglobin: 14.4
Hemoglobin: 8.6 — ABNORMAL LOW
Hemoglobin: 7.9 — CL
Hemoglobin: 8.1 — ABNORMAL LOW
Hemoglobin: 8.5 — ABNORMAL LOW
Hemoglobin: 8.8 — ABNORMAL LOW
Hemoglobin: 9.6 — ABNORMAL LOW
MCHC: 34.2
MCHC: 34.3
MCHC: 34.7
MCHC: 34.8
MCHC: 35
MCHC: 34.1
MCHC: 34.2
MCHC: 34.8
MCV: 94.5
MCV: 95.2
MCV: 95.7
MCV: 95.9
MCV: 96
MCV: 94.7
MCV: 95.1
Platelets: 131 — ABNORMAL LOW
Platelets: 182
Platelets: 117 — ABNORMAL LOW
Platelets: 129 — ABNORMAL LOW
Platelets: 177
RBC: 2.59 — ABNORMAL LOW
RBC: 4.01 — ABNORMAL LOW
RBC: 4.07 — ABNORMAL LOW
RBC: 4.11 — ABNORMAL LOW
RBC: 2.38 — ABNORMAL LOW
RBC: 2.43 — ABNORMAL LOW
RBC: 2.61 — ABNORMAL LOW
RBC: 2.72 — ABNORMAL LOW
RDW: 13.8
RDW: 13.8
RDW: 14.3
RDW: 14.5
RDW: 15
RDW: 15.2
WBC: 12.7 — ABNORMAL HIGH
WBC: 6.1
WBC: 7.9
WBC: 8.7
WBC: 11.2 — ABNORMAL HIGH
WBC: 8.4
WBC: 8.6
WBC: 9.2

## 2010-10-22 LAB — HEMOGLOBIN AND HEMATOCRIT, BLOOD
HCT: 21.5 — ABNORMAL LOW
Hemoglobin: 7.5 — CL

## 2010-10-22 LAB — POCT I-STAT, CHEM 8
Chloride: 102
Glucose, Bld: 220 — ABNORMAL HIGH
HCT: 23 — ABNORMAL LOW
HCT: 24 — ABNORMAL LOW
Hemoglobin: 7.8 — CL
Hemoglobin: 8.2 — ABNORMAL LOW
Potassium: 4.1
Potassium: 3.8
Sodium: 141
Sodium: 140
TCO2: 22

## 2010-10-22 LAB — BASIC METABOLIC PANEL
BUN: 10
BUN: 14
BUN: 11
BUN: 17
BUN: 19
CO2: 24
CO2: 25
CO2: 25
CO2: 27
CO2: 24
CO2: 26
CO2: 28
CO2: 28
Calcium: 8.4
Calcium: 7.3 — ABNORMAL LOW
Calcium: 7.5 — ABNORMAL LOW
Calcium: 7.8 — ABNORMAL LOW
Calcium: 8 — ABNORMAL LOW
Chloride: 106
Chloride: 107
Chloride: 107
Chloride: 109
Chloride: 107
Chloride: 109
Creatinine, Ser: 1.08
Creatinine, Ser: 1.1
Creatinine, Ser: 0.95
Creatinine, Ser: 0.96
Creatinine, Ser: 1.02
Creatinine, Ser: 1.06
GFR calc Af Amer: >60
GFR calc Af Amer: >60
GFR calc Af Amer: >60
GFR calc Af Amer: >60
GFR calc Af Amer: >60
GFR calc non Af Amer: >60
GFR calc non Af Amer: >60
GFR calc non Af Amer: >60
Glucose, Bld: 114 — ABNORMAL HIGH
Glucose, Bld: 121 — ABNORMAL HIGH
Glucose, Bld: 93
Glucose, Bld: 75
Glucose, Bld: 86
Glucose, Bld: 92
Potassium: 3.6
Potassium: 3.7
Potassium: 3.5
Potassium: 3.6
Sodium: 140
Sodium: 139
Sodium: 140
Sodium: 141

## 2010-10-22 LAB — URINALYSIS, DIPSTICK ONLY
Glucose, UA: NEGATIVE
Hgb urine dipstick: NEGATIVE
Protein, ur: NEGATIVE
Specific Gravity, Urine: 1.007

## 2010-10-22 LAB — COMPREHENSIVE METABOLIC PANEL
Albumin: 3.5
BUN: 11
Calcium: 8.5
Chloride: 104
Creatinine, Ser: 1.14
Total Bilirubin: 0.5
Total Protein: 6.4

## 2010-10-22 LAB — POCT I-STAT 3, ART BLOOD GAS (G3+)
O2 Saturation: 100
O2 Saturation: 97
O2 Saturation: 98
O2 Saturation: 100
Patient temperature: 37.5
Patient temperature: 37.1
TCO2: 25
TCO2: 27
pCO2 arterial: 35.5
pCO2 arterial: 39.2
pCO2 arterial: 46.1 — ABNORMAL HIGH
pCO2 arterial: 36.7
pH, Arterial: 7.389
pH, Arterial: 7.396
pO2, Arterial: 88
pO2, Arterial: 247 — ABNORMAL HIGH

## 2010-10-22 LAB — DIFFERENTIAL
Basophils Absolute: 0
Eosinophils Absolute: 0.2
Eosinophils Relative: 2
Lymphocytes Relative: 41
Monocytes Absolute: 0.6

## 2010-10-22 LAB — CREATININE, SERUM
Creatinine, Ser: 0.93
Creatinine, Ser: 1.05
GFR calc Af Amer: >60
GFR calc non Af Amer: >60
GFR calc non Af Amer: >60

## 2010-10-22 LAB — LIPID PANEL
Cholesterol: 208 — ABNORMAL HIGH
HDL: 27 — ABNORMAL LOW
HDL: 28 — ABNORMAL LOW
Total CHOL/HDL Ratio: 6.7
Total CHOL/HDL Ratio: 7.7
VLDL: 36

## 2010-10-22 LAB — TYPE AND SCREEN: Antibody Screen: NEGATIVE

## 2010-10-22 LAB — PROTIME-INR
INR: 1
Prothrombin Time: 13.9

## 2010-10-22 LAB — CARDIAC PANEL(CRET KIN+CKTOT+MB+TROPI)
Relative Index: 6.3 — ABNORMAL HIGH
Total CK: 105
Total CK: 105
Troponin I: 0.28 — ABNORMAL HIGH
Troponin I: 0.31 — ABNORMAL HIGH

## 2010-10-22 LAB — PLATELET COUNT: Platelets: 187

## 2010-10-22 LAB — PREPARE RBC (CROSSMATCH)

## 2010-10-22 LAB — MAGNESIUM
Magnesium: 2.3
Magnesium: 3.4 — ABNORMAL HIGH
Magnesium: 2.9 — ABNORMAL HIGH
Magnesium: 3 — ABNORMAL HIGH

## 2010-10-22 LAB — B-NATRIURETIC PEPTIDE (CONVERTED LAB): Pro B Natriuretic peptide (BNP): 287 — ABNORMAL HIGH

## 2010-10-22 LAB — HEMOGLOBIN A1C: Mean Plasma Glucose: 136

## 2010-10-22 LAB — APTT: aPTT: 36

## 2011-06-06 ENCOUNTER — Ambulatory Visit (HOSPITAL_COMMUNITY)
Admission: RE | Admit: 2011-06-06 | Discharge: 2011-06-06 | Disposition: A | Discharge status: Home / Self Care | Payer: Medicare Other | Source: Ambulatory Visit | Attending: Cardiovascular Disease | Admitting: Cardiovascular Disease

## 2011-06-06 ENCOUNTER — Other Ambulatory Visit: Payer: Self-pay | Admitting: Cardiovascular Disease

## 2011-06-06 ENCOUNTER — Encounter (HOSPITAL_COMMUNITY): Payer: Self-pay | Admitting: Pharmacy Technician

## 2011-06-06 ENCOUNTER — Other Ambulatory Visit (HOSPITAL_COMMUNITY): Payer: Self-pay | Admitting: Cardiovascular Disease

## 2011-06-06 DIAGNOSIS — J9819 Other pulmonary collapse: Secondary | ICD-10-CM | POA: Insufficient documentation

## 2011-06-06 DIAGNOSIS — Z01811 Encounter for preprocedural respiratory examination: Secondary | ICD-10-CM

## 2011-06-06 DIAGNOSIS — Z9889 Other specified postprocedural states: Secondary | ICD-10-CM | POA: Insufficient documentation

## 2011-06-06 DIAGNOSIS — F17200 Nicotine dependence, unspecified, uncomplicated: Secondary | ICD-10-CM | POA: Insufficient documentation

## 2011-06-08 ENCOUNTER — Ambulatory Visit (HOSPITAL_COMMUNITY)
Admission: RE | Admit: 2011-06-08 | Discharge: 2011-06-08 | Disposition: A | Discharge status: Home / Self Care | Payer: Medicare Other | Source: Ambulatory Visit | Attending: Cardiovascular Disease | Admitting: Cardiovascular Disease

## 2011-06-08 ENCOUNTER — Encounter (HOSPITAL_COMMUNITY)
Admission: RE | Disposition: A | Discharge status: Home / Self Care | Payer: Self-pay | Source: Ambulatory Visit | Attending: Cardiovascular Disease

## 2011-06-08 DIAGNOSIS — I2510 Atherosclerotic heart disease of native coronary artery without angina pectoris: Secondary | ICD-10-CM | POA: Insufficient documentation

## 2011-06-08 DIAGNOSIS — I209 Angina pectoris, unspecified: Secondary | ICD-10-CM | POA: Insufficient documentation

## 2011-06-08 DIAGNOSIS — I2582 Chronic total occlusion of coronary artery: Secondary | ICD-10-CM | POA: Insufficient documentation

## 2011-06-08 DIAGNOSIS — R9439 Abnormal result of other cardiovascular function study: Secondary | ICD-10-CM | POA: Insufficient documentation

## 2011-06-08 HISTORY — PX: LEFT HEART CATHETERIZATION WITH CORONARY/GRAFT ANGIOGRAM: SHX5450

## 2011-06-08 SURGERY — LEFT HEART CATHETERIZATION WITH CORONARY/GRAFT ANGIOGRAM
Anesthesia: LOCAL

## 2011-06-08 MED ORDER — HEPARIN (PORCINE) IN NACL 2-0.9 UNIT/ML-% IJ SOLN
INTRAMUSCULAR | Status: AC
  Filled 2011-06-08: qty 2000

## 2011-06-08 MED ORDER — SODIUM CHLORIDE 0.9 % IJ SOLN: 3.0000 mL | INTRAMUSCULAR | Status: DC | PRN

## 2011-06-08 MED ORDER — SODIUM CHLORIDE 0.9 % IJ SOLN: 3.0000 mL | Freq: Two times a day (BID) | INTRAMUSCULAR | Status: DC

## 2011-06-08 MED ORDER — ONDANSETRON HCL 4 MG/2ML IJ SOLN: 4.0000 mg | Freq: Four times a day (QID) | INTRAMUSCULAR | Status: DC | PRN

## 2011-06-08 MED ORDER — BUPROPION HCL ER (SR) 150 MG PO TB12: ORAL_TABLET | ORAL | Status: DC

## 2011-06-08 MED ORDER — SODIUM CHLORIDE 0.9 % IV SOLN
INTRAVENOUS | Status: DC
  Administered 2011-06-08: 08:00:00 via INTRAVENOUS

## 2011-06-08 MED ORDER — SODIUM CHLORIDE 0.9 % IV SOLN: 250.0000 mL | INTRAVENOUS | Status: DC | PRN

## 2011-06-08 MED ORDER — FENTANYL CITRATE 0.05 MG/ML IJ SOLN
INTRAMUSCULAR | Status: AC
  Filled 2011-06-08: qty 2

## 2011-06-08 MED ORDER — ASPIRIN 81 MG PO CHEW
324.0000 mg | CHEWABLE_TABLET | ORAL | Status: AC
  Administered 2011-06-08: 324 mg via ORAL
  Filled 2011-06-08: qty 4

## 2011-06-08 MED ORDER — SODIUM CHLORIDE 0.9 % IV SOLN: 1.0000 mL/kg/h | INTRAVENOUS | Status: DC

## 2011-06-08 MED ORDER — NITROGLYCERIN 0.2 MG/ML ON CALL CATH LAB
INTRAVENOUS | Status: AC
  Filled 2011-06-08: qty 1

## 2011-06-08 MED ORDER — MIDAZOLAM HCL 2 MG/2ML IJ SOLN
INTRAMUSCULAR | Status: AC
  Filled 2011-06-08: qty 2

## 2011-06-08 MED ORDER — LIDOCAINE HCL (PF) 1 % IJ SOLN
INTRAMUSCULAR | Status: AC
  Filled 2011-06-08: qty 30

## 2011-06-08 MED ORDER — ACETAMINOPHEN 325 MG PO TABS: 650.0000 mg | ORAL_TABLET | ORAL | Status: DC | PRN

## 2011-06-08 NOTE — H&P (Signed)
Date of Initial H&P: 06/06/11  History reviewed, patient examined, no change in status, stable for surgery. Recurrent angina pectoris and inferolateral ischemia on nuclear perfusion study roughly 5 years after CABG. Limited antianginal therapy due to orthostatic hypotension. Thurmon Fair, MD, [facility] [phone] office [phone] pager 06/08/2011

## 2011-06-08 NOTE — CV Procedure (Signed)
[name],Nezar M Male, 66 y.o., [birth year]  Location: [facility]   MRN: [id number]  CSN: [id number]  CARDIAC CATHETERIZATION REPORT   Procedures performed:  1. Left heart catheterization  2. Selective coronary angiography  3. Selective angiography of saphenous vein graft bypasses and free left internal mammary artery bypass graft  Reason for procedure:  Coronary artery disease status post previous coronary bypass surgery Stable angina pectoris Abnormal myocardial perfusion study  Procedure performed by: Thurmon Fair, MD, [facility]  Complications: none   Estimated blood loss: less than 5 mL   History:  66 year old gentleman roughly 5 years status post three-vessel bypass surgery with recent recurrence of exertional angina pectoris and an extensive inferolateral area of ischemia by nuclear perfusion imaging. Antianginal medications are limited by orthostatic hypotension. The patient continues to smoke. Other risk factors appear to be generally well-controlled.  Consent: The risks, benefits, and details of the procedure were explained to the patient. Risks including death, MI, stroke, bleeding, limb ischemia, renal failure and allergy were described and accepted by the patient. Informed written consent was obtained prior to proceeding.  Technique: The patient was brought to the cardiac catheterization laboratory in the fasting state. He was prepped and draped in the usual sterile fashion. Local anesthesia with 1% lidocaine was administered to the right groin area. Using the modified Seldinger technique a 5 French right common femoral artery sheath was introduced without difficulty. By fluoroscopy the iliac arteries and abdominal aorta were heavily calcified and mild difficulty was encountered traversing the pelvis with the J-tip guidewire. Subsequent catheter exchanges were performed over a exchange length wire. Under fluoroscopic guidance, using 5 [place] JL4, JR and angled pigtail catheters,  selective cannulation of the left coronary artery, right coronary artery, saphenous vein graft bypass to the right coronary artery, saphenous vein graft bypass to the oblique marginal branch and left ventricle were respectively performed. The left subclavian artery was demonstrated to be totally occluded which is a chronic abnormality and which is why the LIMA was used as a free graft. Several coronary angiograms o each vessel in a variety of projections were recorded. Left ventricular pressure and a pull back to the aorta were recorded. No immediate complications occurred. At the end of the procedure, all catheters were removed. After the procedure, hemostasis will be achieved with manual pressure.  Contrast used: 100 mL Omnipaque  Angiographic Findings:  1. The left main coronary artery exhibits calcification and extensive diffuse atherosclerosis that does not cause hemodynamically significant obstruction. It bifurcates in the usual fashion into the left anterior descending artery and left circumflex coronary artery, also generating a medium-size ramus intermedius artery/very proximal first diagonal.   2. The left anterior descending artery is a large vessel that that previously reached the apex and wraps around it to supply the distal third of the inferior wall. It is now totally occluded following the second septal branch. In this proximal portion it only generates the ramus intermedius/high diagonal artery described above. The distal LAD artery receives flow via a free LIMA bypass. The ramus intermedius has a long segment of proximal 60-70% stenosis. Distally, the ramus caudal branch has a high-grade 90% stenosis but this vessel is no more than 1 mm in diameter. 3. The left circumflex coronary artery is a large-size vessel non- dominant vessel that generates 4 major oblique marginal arteries. The left circumflex AV groove vessel is severely diffusely diseased and calcified.  The first oblique marginal  artery is by far the largest and has a 95% proximal  stenosis. After this a previously placed stent is seen but the stent is completely occluded in its midportion. The oblique marginal artery distally receives flow via the saphenous graft bypass. A second oblique marginal artery is totally occluded almost immediately after its ostium. The third and fourth oblique marginal branches are relatively small and it shows severe diffuse disease. There is at least a 85-90% stenosis just before the final oblique marginal branch, but the distal vessel is no more than a millimeter in diameter. 4. The right coronary artery is a medium-size dominant vessel that generates a small posterior lateral ventricular system as well as the relatively short posterior descending artery. The native right coronary artery shows severe diffuse disease and extensive calcification and is totally occluded in its midportion. The distal vessel receives flow via the saphenous vein graft bypass.  5. The free LIMA bypass to the mid LAD artery is a healthy-appearing widely patent vessel. The distal LAD artery shows diffuse luminal irregularities with approximately 50% mid-distal stenosis and a 70% distal stenosis, but with excellent flow. 6. The saphenous vein graft bypass to the oblique marginal artery is a healthy appearing vessel. It serves a relatively small territory of the lateral wall. 7. The saphenous vein graft bypass to the right coronary artery has severe diffuse luminal irregularities but excellent flow and no hemodynamically significant stenoses. A few ulcerated plaques are seen in the proximal third and distal third respectively. The branches of the right coronary artery are very small and diffusely diseased the proximal portion of the posterior descending artery has at least 90% stenosed but the vessel is very small and diffusely diseased downstream. 8. The left ventricle was not injected. There is no aortic valve stenosis by  pullback. The left ventricular end-diastolic pressure is 11 mm Hg. By nuclear perfusion imaging ejection fraction was normal at 55% with inferolateral hypokinesis. The inferolateral wall. A combination of scar and ischemia.   IMPRESSIONS:  Compared to the previous angiograms there has been significant progression of disease in Mr. Vernon's native vessels. The right coronary was chronically totally occluded but then now there is also total occlusion of the proximal LAD artery and the first oblique marginal artery. Thankfully the bypasses to these areas are widely open. There is severe disease in the left circumflex coronary artery distribution in the territory of the second third and fourth oblique marginal vessels that is not amenable to percutaneous or surgical revascularization.   RECOMMENDATION:  Medical management with antianginal therapy. This has been limited by problems with orthostatic hypotension. Ranexa will be used as the preferred agent because of its minimal effect on hemodynamics. Smoking cessation is critically important. This is the single remaining major risk factor that might be responsible for continued progression of atherosclerotic coronary disease.     Thurmon Fair, MD, [facility] [phone] office [phone] pager 06/08/2011 10:17 AM  Cc: [employer], [place], [place]

## 2011-06-08 NOTE — Discharge Instructions (Signed)

## 2012-06-20 ENCOUNTER — Other Ambulatory Visit: Payer: Self-pay | Admitting: *Deleted

## 2012-06-20 MED ORDER — LISINOPRIL 5 MG PO TABS: 5.0000 mg | ORAL_TABLET | Freq: Every day | ORAL | Status: DC

## 2012-06-20 MED ORDER — CLOPIDOGREL BISULFATE 75 MG PO TABS: 75.0000 mg | ORAL_TABLET | Freq: Every day | ORAL | Status: DC

## 2012-06-20 NOTE — Telephone Encounter (Signed)
Clopidogrel and Lisinopril  Refilled x 6 [facility]

## 2012-08-15 ENCOUNTER — Other Ambulatory Visit: Payer: Self-pay | Admitting: Cardiovascular Disease

## 2012-08-15 ENCOUNTER — Other Ambulatory Visit: Payer: Self-pay | Admitting: *Deleted

## 2012-08-15 MED ORDER — RANOLAZINE ER 1000 MG PO TB12: 1000.0000 mg | ORAL_TABLET | Freq: Two times a day (BID) | ORAL | Status: DC

## 2012-09-17 ENCOUNTER — Other Ambulatory Visit (HOSPITAL_COMMUNITY): Payer: Self-pay | Admitting: Family Medicine

## 2012-09-17 ENCOUNTER — Ambulatory Visit (HOSPITAL_COMMUNITY)
Admission: RE | Admit: 2012-09-17 | Discharge: 2012-09-17 | Disposition: A | Discharge status: Home / Self Care | Payer: Medicare Other | Source: Ambulatory Visit | Attending: Family Medicine | Admitting: Family Medicine

## 2012-09-17 DIAGNOSIS — R5381 Other malaise: Secondary | ICD-10-CM

## 2012-09-17 DIAGNOSIS — F17200 Nicotine dependence, unspecified, uncomplicated: Secondary | ICD-10-CM | POA: Insufficient documentation

## 2012-09-17 DIAGNOSIS — I10 Essential (primary) hypertension: Secondary | ICD-10-CM

## 2012-09-28 ENCOUNTER — Other Ambulatory Visit: Payer: Self-pay | Admitting: Cardiovascular Disease

## 2012-10-02 NOTE — Telephone Encounter (Signed)
Rx was sent to pharmacy electronically. 

## 2012-10-08 ENCOUNTER — Other Ambulatory Visit: Payer: Self-pay | Admitting: Cardiovascular Disease

## 2012-10-09 NOTE — Telephone Encounter (Signed)
Rx called into [facility].

## 2012-10-30 ENCOUNTER — Encounter: Payer: Self-pay | Admitting: Cardiovascular Disease

## 2012-10-30 ENCOUNTER — Ambulatory Visit (INDEPENDENT_AMBULATORY_CARE_PROVIDER_SITE_OTHER): Payer: Medicare Other | Admitting: Cardiovascular Disease

## 2012-10-30 VITALS — BP 128/70 | HR 53 | Ht 67.0 in | Wt 184.5 lb

## 2012-10-30 DIAGNOSIS — Z79899 Other long term (current) drug therapy: Secondary | ICD-10-CM

## 2012-10-30 DIAGNOSIS — Z720 Tobacco use: Secondary | ICD-10-CM

## 2012-10-30 DIAGNOSIS — E785 Hyperlipidemia, unspecified: Secondary | ICD-10-CM

## 2012-10-30 DIAGNOSIS — I10 Essential (primary) hypertension: Secondary | ICD-10-CM

## 2012-10-30 DIAGNOSIS — R5381 Other malaise: Secondary | ICD-10-CM

## 2012-10-30 DIAGNOSIS — F17200 Nicotine dependence, unspecified, uncomplicated: Secondary | ICD-10-CM

## 2012-10-30 DIAGNOSIS — I2510 Atherosclerotic heart disease of native coronary artery without angina pectoris: Secondary | ICD-10-CM

## 2012-10-30 DIAGNOSIS — E782 Mixed hyperlipidemia: Secondary | ICD-10-CM

## 2012-10-30 DIAGNOSIS — I739 Peripheral vascular disease, unspecified: Secondary | ICD-10-CM

## 2012-10-30 MED ORDER — ISOSORBIDE MONONITRATE ER 30 MG PO TB24
ORAL_TABLET | ORAL | Status: DC
Start: 2012-10-30 — End: 2013-07-09

## 2012-10-30 MED ORDER — NITROGLYCERIN 0.4 MG SL SUBL: 0.4000 mg | SUBLINGUAL_TABLET | SUBLINGUAL | Status: DC | PRN

## 2012-10-30 NOTE — Patient Instructions (Addendum)
Your physician recommends that you schedule a follow-up appointment in: 6 weeks.  START Isosorbide MN 30mg  take one tablet one hour before bedtime.  Have lab work done one morning before you have had anything to eat or drink.

## 2012-10-31 ENCOUNTER — Encounter: Payer: Self-pay | Admitting: Cardiovascular Disease

## 2012-10-31 DIAGNOSIS — I2510 Atherosclerotic heart disease of native coronary artery without angina pectoris: Secondary | ICD-10-CM

## 2012-10-31 DIAGNOSIS — Z720 Tobacco use: Secondary | ICD-10-CM | POA: Insufficient documentation

## 2012-10-31 DIAGNOSIS — I10 Essential (primary) hypertension: Secondary | ICD-10-CM | POA: Insufficient documentation

## 2012-10-31 DIAGNOSIS — E785 Hyperlipidemia, unspecified: Secondary | ICD-10-CM

## 2012-10-31 DIAGNOSIS — I739 Peripheral vascular disease, unspecified: Secondary | ICD-10-CM

## 2012-10-31 DIAGNOSIS — E782 Mixed hyperlipidemia: Secondary | ICD-10-CM | POA: Insufficient documentation

## 2012-10-31 HISTORY — DX: Hyperlipidemia, unspecified: E78.5

## 2012-10-31 HISTORY — DX: Essential (primary) hypertension: I10

## 2012-10-31 HISTORY — DX: Peripheral vascular disease, unspecified: I73.9

## 2012-10-31 HISTORY — DX: Atherosclerotic heart disease of native coronary artery without angina pectoris: I25.10

## 2012-10-31 NOTE — Assessment & Plan Note (Signed)
Unfortunately the laboratory tests that we obtained from August of this year did not include a lipid profile. His creatinine was 1.18 and liver function tests were normal as was the hemoglobin and the thyroid function studies. The fasting glucose was 103. If we cannot locate a full lipid profile we should order one.

## 2012-10-31 NOTE — Assessment & Plan Note (Addendum)
Excellent control. He has had some symptoms of orthostatic hypotension which have limited the use of conventional antianginal medications. Since his angina symptoms are nocturnal and problems with hypotension are less likely to be an issue at night, he will take his isosorbide mononitrate before bedtime. Bradycardia prevents additional beta blocker therapy.

## 2012-10-31 NOTE — Assessment & Plan Note (Addendum)
When he had cardiac catheterization in 2009 he had severe three-vessel CAD with diffuse involvement of the distal vessels was actually felt to be a poor candidate for bypass surgery. His left ventricular ejection fraction was 25%. He did undergo bypass surgery by Dr. Laneta Simmers in July of 2009 and had a remarkable improvement clinically and increased left ventricular systolic function with an ejection fraction that went up to 55%.  Last year he developed lifestyle limiting exertional angina. Nuclear stress test performed in May of 2013 showed an extensive area of inferolateral ischemia and he underwent cardiac catheterization . There was severe distal disease especially at the level of the left circumflex coronary artery and the distal posterior descending artery and distal LAD. Only 3 bypasses were identified, there was no bypass to the ramus intermedius artery. Because of the diffuse nature of his CAD, medical management was preferred. He had a remarkably good response to Ranexa. He now has recurrent symptoms that appear to be most prominent during supine position, suggesting a possible component of congestive heart failure. He does not have exertional dyspnea and his exam does not show signs of fluid overload. We'll try to add isosorbide mononitrate in the evening, to be taken at least one hour before bedtime. If this is unsuccessful, consider repeat coronary angiography.

## 2012-10-31 NOTE — Assessment & Plan Note (Signed)
He has made very good progress in his efforts to quit smoking and is very close to complete smoking cessation. He was counseled and encouraged in this endeavor.

## 2012-10-31 NOTE — Progress Notes (Signed)
Patient ID: [name], male   DOB: [birth year], 67 y.o.   MRN: [id number]      Reason for office visit CAD status post CABG  [name] is having chest discomfort again. The symptoms are identical in location and quality to his previous exertional angina but are now occurring at night. They have been every 2 weeks or so. If symptoms persist for about 45 minutes to an hour unless he sits up and walks around which case they get better. He has not taken nitroglycerin. He denies exertional dyspnea and has not had any lower extremity edema or weight gain. He thinks that the symptoms are completely distinct from the burning sensation that he associates with gastroesophageal reflux. He is taking a proton pump inhibitor daily. He is making slow but steady progress towards his goal of smoking cessation. He is taking Chantix. He has only smoked 2 cigarettes so far today. He has an extensive history of coronary disease. In 2009 he had a non-ST segment elevation myocardial infarction and subsequently underwent bypass surgery. He had previously received stents to the LAD and the circumflex. His coronary lesions are quite distal and it was initially felt to be a inadequate target for bypass surgery. Nevertheless he had an excellent response to bypass with resolution of angina pectoris in a marked improvement in LVEF from 25% to virtually normal. Last year he returned with angina, had inferolateral ischemia nuclear stress testing and his cardiac catheterization showed multiple distal lesions. Significant stenoses were seen in the oblique marginal branches of the left circumflex coronary artery, the distal diagonal/ramus intermedius artery, the distal LAD and the distal PDA. His bypasses were open to the LAD (free LIMA), OM 2 and PDA, but the bypass to the diagonal/ramus intermedius could not be identified No good options for revascularization were seen. Fortunately he had an excellent response to antianginal  therapy with Ranexa. He did not tolerate increases in conventional antianginal medications because of orthostatic hypotension.   No Known Allergies  Current Outpatient Prescriptions  Medication Sig Dispense Refill  . aspirin EC 81 MG tablet Take 81 mg by mouth daily.      . calcium carbonate (TUMS - DOSED IN MG ELEMENTAL CALCIUM) 500 MG chewable tablet Chew 1 tablet by mouth 3 (three) times daily as needed. For acid stomach      . carvedilol (COREG) 3.125 MG tablet Take 1 tablet (3.125 mg total) by mouth 2 (two) times daily.  20 tablet  0  . clopidogrel (PLAVIX) 75 MG tablet Take 1 tablet (75 mg total) by mouth daily.  30 tablet  6  . lisinopril (PRINIVIL,ZESTRIL) 5 MG tablet Take 1 tablet (5 mg total) by mouth daily.  30 tablet  6  . Omega-3 Fatty Acids (FISH OIL) 1000 MG CAPS Take 1 capsule by mouth daily.      Maxwell Caul Bicarbonate (ZEGERID) 20-1100 MG CAPS capsule Take 1 capsule by mouth daily before breakfast.      . ranolazine (RANEXA) 1000 MG SR tablet Take 1 tablet (1,000 mg total) by mouth 2 (two) times daily.  60 each  3  . simvastatin (ZOCOR) 40 MG tablet Take 40 mg by mouth every evening.      . varenicline (CHANTIX) 1 MG tablet Take 1 mg by mouth 2 (two) times daily.      . isosorbide mononitrate (IMDUR) 30 MG 24 hr tablet Take one tablet one hour before bedtime.  30 tablet  6  . nitroGLYCERIN (NITROSTAT) 0.4 MG  SL tablet Place 1 tablet (0.4 mg total) under the tongue every 5 (five) minutes as needed for chest pain.  25 tablet  6   No current facility-administered medications for this visit.    No past medical history on file.  No past surgical history on file.  No family history on file.  History   Social History  . Marital Status: Legally Separated    Spouse Name: N/A    Number of Children: N/A  . Years of Education: N/A   Occupational History  . Not on file.   Social History Main Topics  . Smoking status: Current Every Day Smoker  . Smokeless  tobacco: Not on file  . Alcohol Use: Yes     Comment: occas.  . Drug Use: No  . Sexual Activity: Not on file   Other Topics Concern  . Not on file   Social History Narrative  . No narrative on file    Review of systems: The patient specifically denies dyspnea at rest or with exertion, orthopnea, syncope, palpitations, focal neurological deficits, intermittent claudication, lower extremity edema, unexplained weight gain, cough, hemoptysis or wheezing.  The patient also denies abdominal pain, nausea, vomiting, dysphagia, diarrhea, constipation, polyuria, polydipsia, dysuria, hematuria, frequency, urgency, abnormal bleeding or bruising, fever, chills, unexpected weight changes, mood swings, change in skin or hair texture, change in voice quality, auditory or visual problems, allergic reactions or rashes, new musculoskeletal complaints other than usual "aches and pains".   PHYSICAL EXAM BP 128/70  Pulse 53  Ht 5\' 7"  (1.702 m)  Wt 184 lb 8 oz (83.689 kg)  BMI 28.89 kg/m2 Blood pressure is 50 mm Hg lower in the left brachial General: Alert, oriented x3, no distress Head: no evidence of trauma, PERRL, EOMI, no exophtalmos or lid lag, no myxedema, no xanthelasma; normal ears, nose and oropharynx Neck: normal jugular venous pulsations and no hepatojugular reflux; brisk carotid pulses without delay and no carotid bruits Chest: clear to auscultation, no signs of consolidation by percussion or palpation, normal fremitus, symmetrical and full respiratory excursions; healed sternotomy scar Cardiovascular: normal position and quality of the apical impulse, regular rhythm, normal first and second heart sounds, no murmurs, rubs or gallops Abdomen: no tenderness or distention, no masses by palpation, no abnormal pulsatility or arterial bruits, normal bowel sounds, no hepatosplenomegaly Extremities: no clubbing, cyanosis or edema; 2+ radial, ulnar and brachial pulses in the right upper extremity, absent  pulses in the left upper extremity; 2+ right femoral, posterior tibial and dorsalis pedis pulses; 2+ left femoral, posterior tibial and dorsalis pedis pulses; no subclavian or femoral bruits Neurological: grossly nonfocal   EKG: Sinus bradycardia, nonspecific IVCD (QRS 126 ms) probable left ventricular hypertrophy, ST segment depression T wave inversion in leads V4 throughV6-II-III and aVF.  QTC 435 ms. The repolarization abnormalities are similar to those seen last year.  Lipid Panel     Component Value Date/Time   CHOL  Value: 188        ATP III CLASSIFICATION:  <200     mg/dL   Desirable  454-098  mg/dL   Borderline High  >=119    mg/dL   High 1/47/8295 6213   TRIG 170* 08/24/2007 0320   HDL 28* 08/24/2007 0320   CHOLHDL 6.7 08/24/2007 0320   VLDL 34 08/24/2007 0320   LDLCALC  Value: 126        Total Cholesterol/HDL:CHD Risk Coronary Heart Disease Risk Table  Men   Women  1/2 Average Risk   3.4   3.3* 08/24/2007 0320    BMET    Component Value Date/Time   NA 139 08/28/2007 0420   K 3.9 08/28/2007 0420   CL 106 08/28/2007 0420   CO2 28 08/28/2007 0420   GLUCOSE 86 08/28/2007 0420   BUN 11 08/28/2007 0420   CREATININE 0.96 08/28/2007 0420   CALCIUM 8.0* 08/28/2007 0420   GFRNONAA >60 08/28/2007 0420   GFRAA  Value: >60        The eGFR has been calculated using the MDRD equation. This calculation has not been validated in all clinical 08/28/2007 0420     ASSESSMENT AND PLAN CAD s/p CABG 2009 When he had cardiac catheterization in 2009 he had severe three-vessel CAD with diffuse involvement of the distal vessels was actually felt to be a poor candidate for bypass surgery. His left ventricular ejection fraction was 25%. He did undergo bypass surgery by Dr. Laneta Simmers in July of 2009 and had a remarkable improvement clinically and increased left ventricular systolic function with an ejection fraction that went up to 55%.  Last year he developed lifestyle limiting exertional angina. Nuclear  stress test performed in May of 2013 showed an extensive area of inferolateral ischemia and he underwent cardiac catheterization . There was severe distal disease especially at the level of the left circumflex coronary artery and the distal posterior descending artery and distal LAD. Only 3 bypasses were identified, there was no bypass to the ramus intermedius artery. Because of the diffuse nature of his CAD, medical management was preferred. He had a remarkably good response to Ranexa. He now has recurrent symptoms that appear to be most prominent during supine position, suggesting a possible component of congestive heart failure. He does not have exertional dyspnea and his exam does not show signs of fluid overload. We'll try to add isosorbide mononitrate in the evening, to be taken at least one hour before bedtime. If this is unsuccessful, consider repeat coronary angiography.  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  Tobacco abuse He has made very good progress in his efforts to quit smoking and is very close to complete smoking cessation. He was counseled and encouraged in this endeavor.  HTN (hypertension) Excellent control. He has had some symptoms of orthostatic hypotension which have limited the use of conventional antianginal medications. Since his angina symptoms are nocturnal and problems with hypotension are less likely to be an issue at night, he will take his isosorbide mononitrate before bedtime. Bradycardia prevents additional beta blocker therapy.  Hyperlipidemia Unfortunately the laboratory tests that we obtained from August of this year did not include a lipid profile. His creatinine was 1.18 and liver function tests were normal as was the hemoglobin and the thyroid function studies. The fasting glucose was 103. If we cannot locate a full lipid profile we should order one.   Orders Placed  This Encounter  Procedures  . EKG 12-Lead   Meds ordered this encounter  Medications  . Omeprazole-Sodium Bicarbonate (ZEGERID) 20-1100 MG CAPS capsule    Sig: Take 1 capsule by mouth daily before breakfast.  . varenicline (CHANTIX) 1 MG tablet    Sig: Take 1 mg by mouth 2 (two) times daily.  . isosorbide mononitrate (IMDUR) 30 MG 24 hr tablet    Sig: Take one tablet one hour before bedtime.  Dispense:  30 tablet    Refill:  6  . nitroGLYCERIN (NITROSTAT) 0.4 MG SL tablet    Sig: Place 1 tablet (0.4 mg total) under the tongue every 5 (five) minutes as needed for chest pain.    Dispense:  25 tablet    Refill:  6    Kassey Laforest  Thurmon Fair, MD, [facility] [phone] office [phone] pager

## 2012-10-31 NOTE — Assessment & Plan Note (Signed)
He does not appear to have much in the with symptoms of arm claudication.

## 2012-11-05 ENCOUNTER — Other Ambulatory Visit: Payer: Self-pay | Admitting: Cardiovascular Disease

## 2012-11-05 ENCOUNTER — Telehealth: Payer: Self-pay | Admitting: Cardiovascular Disease

## 2012-11-05 MED ORDER — SIMVASTATIN 40 MG PO TABS: 40.0000 mg | ORAL_TABLET | Freq: Every evening | ORAL | Status: DC

## 2012-11-05 NOTE — Telephone Encounter (Signed)
Rx was sent to pharmacy electronically. 

## 2012-11-05 NOTE — Telephone Encounter (Signed)
Re simvastatin was sent in 40 mg and patient has been on 20   Please call to clarify

## 2012-11-05 NOTE — Telephone Encounter (Signed)
Informed pharmacist per patient that he has been taking whatever mg that is on the bottle that he brought to the pharmacy. Which is simvastatin 20mg . Patient states that he doesn't know how the 40 mg got recorded when he was in for his appointment.

## 2012-11-06 ENCOUNTER — Encounter: Payer: Self-pay | Admitting: Cardiovascular Disease

## 2012-11-12 ENCOUNTER — Other Ambulatory Visit: Payer: Self-pay | Admitting: Cardiovascular Disease

## 2012-11-12 NOTE — Telephone Encounter (Signed)
Rx was sent to pharmacy electronically. 

## 2012-12-11 ENCOUNTER — Ambulatory Visit (INDEPENDENT_AMBULATORY_CARE_PROVIDER_SITE_OTHER): Payer: Medicare Other | Admitting: Cardiovascular Disease

## 2012-12-11 ENCOUNTER — Telehealth: Payer: Self-pay | Admitting: Cardiovascular Disease

## 2012-12-11 ENCOUNTER — Encounter: Payer: Self-pay | Admitting: Cardiovascular Disease

## 2012-12-11 VITALS — BP 141/83 | HR 64 | Resp 20 | Ht 67.0 in | Wt 187.9 lb

## 2012-12-11 DIAGNOSIS — I10 Essential (primary) hypertension: Secondary | ICD-10-CM

## 2012-12-11 DIAGNOSIS — E782 Mixed hyperlipidemia: Secondary | ICD-10-CM

## 2012-12-11 DIAGNOSIS — Z720 Tobacco use: Secondary | ICD-10-CM

## 2012-12-11 DIAGNOSIS — E785 Hyperlipidemia, unspecified: Secondary | ICD-10-CM

## 2012-12-11 DIAGNOSIS — I2510 Atherosclerotic heart disease of native coronary artery without angina pectoris: Secondary | ICD-10-CM

## 2012-12-11 DIAGNOSIS — I739 Peripheral vascular disease, unspecified: Secondary | ICD-10-CM

## 2012-12-11 DIAGNOSIS — F17200 Nicotine dependence, unspecified, uncomplicated: Secondary | ICD-10-CM

## 2012-12-11 MED ORDER — VARENICLINE TARTRATE 1 MG PO TABS: 1.0000 mg | ORAL_TABLET | Freq: Every day | ORAL | Status: DC

## 2012-12-11 MED ORDER — RANOLAZINE ER 1000 MG PO TB12: 1000.0000 mg | ORAL_TABLET | Freq: Two times a day (BID) | ORAL | Status: DC

## 2012-12-11 NOTE — Telephone Encounter (Signed)
Please call-need to verify directions on his Chantix.

## 2012-12-11 NOTE — Progress Notes (Signed)
Patient ID: [name], male   DOB: 01/29/1945, 67 y.o.   MRN: [id number]      Reason for office visit CAD, smoking cessation counseling  [name] is doing better. He has only had one episode of chest discomfort in the 6 weeks that have passed since his last appointment. He is making some progress with smoking cessation but feels that he is not yet ready to discontinue Chantix. He is down to 5-6 cigarettes a day. He has gained a little weight.  He underwent bypass surgery in 2009 for severe three-vessel coronary disease associated with severe cardiomyopathy (EF 25%). His EF improved back to normal range. He has developed recurrent angina pectoris. Nuclear stress test performed in May of 2013 showed an extensive area of inferolateral ischemia and he underwent cardiac catheterization . There was severe distal disease especially at the level of the left circumflex coronary artery and the distal posterior descending artery and distal LAD. Only 3 bypasses were identified, there was no bypass to the ramus intermedius artery. Because of the diffuse nature of his CAD, medical management was preferred. Adding Ranexa and subsequently adding nitrates at bedtime have helped. Use of conventional antianginal medications, especially beta blockers, has been limited by orthostatic hypotension.    No Known Allergies  Current Outpatient Prescriptions  Medication Sig Dispense Refill  . aspirin EC 81 MG tablet Take 81 mg by mouth daily.      . calcium carbonate (TUMS - DOSED IN MG ELEMENTAL CALCIUM) 500 MG chewable tablet Chew 1 tablet by mouth 3 (three) times daily as needed. For acid stomach      . carvedilol (COREG) 3.125 MG tablet TAKE ONE TABLET BY MOUTH TWICE A DAY  60 tablet  6  . clopidogrel (PLAVIX) 75 MG tablet Take 1 tablet (75 mg total) by mouth daily.  30 tablet  6  . isosorbide mononitrate (IMDUR) 30 MG 24 hr tablet Take one tablet one hour before bedtime.  30 tablet  6  . lisinopril  (PRINIVIL,ZESTRIL) 5 MG tablet Take 1 tablet (5 mg total) by mouth daily.  30 tablet  6  . nitroGLYCERIN (NITROSTAT) 0.4 MG SL tablet Place 1 tablet (0.4 mg total) under the tongue every 5 (five) minutes as needed for chest pain.  25 tablet  6  . Omega-3 Fatty Acids (FISH OIL) 1000 MG CAPS Take 1 capsule by mouth daily.      [name] Bicarbonate (ZEGERID) 20-1100 MG CAPS capsule Take 1 capsule by mouth daily before breakfast.      . ranolazine (RANEXA) 1000 MG SR tablet Take 1 tablet (1,000 mg total) by mouth 2 (two) times daily.  60 each  3  . simvastatin (ZOCOR) 40 MG tablet Take 1 tablet (40 mg total) by mouth every evening.  30 tablet  3  . varenicline (CHANTIX) 1 MG tablet Take 1 tablet (1 mg total) by mouth daily.  30 tablet  2   No current facility-administered medications for this visit.    Past Medical History  Diagnosis Date  . CAD s/p CABG 2009 10/31/2012    Free LIMA to LAD (left subclavian artery occlusion), SVG to PDA, SVG to oblique marginal, SVG to ramus intermedius. Previous stents in the LAD artery and left circumflex coronary artery   . HTN (hypertension) 10/31/2012  . Hyperlipidemia 10/31/2012  . 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 Coronary artery bypass  graft  2009    Family History  Problem Relation Age of Onset  . Family history unknown: Yes    History   Social History  . Marital Status: Legally Separated    Spouse Name: N/A    Number of Children: N/A  . Years of Education: N/A   Occupational History  . Not on file.   Social History Main Topics  . Smoking status: Current Every Day Smoker  . Smokeless tobacco: Not on file  . Alcohol Use: Yes     Comment: occas.  . Drug Use: No  . Sexual Activity: Not on file   Other Topics Concern  . Not on file   Social History Narrative  . No narrative on file    Review of systems: The patient specifically  denies dyspnea at rest or with exertion, orthopnea, syncope, palpitations, focal neurological deficits, intermittent claudication, lower extremity edema, unexplained weight gain, cough, hemoptysis or wheezing.  The patient also denies abdominal pain, nausea, vomiting, dysphagia, diarrhea, constipation, polyuria, polydipsia, dysuria, hematuria, frequency, urgency, abnormal bleeding or bruising, fever, chills, unexpected weight changes, mood swings, change in skin or hair texture, change in voice quality, auditory or visual problems, allergic reactions or rashes, new musculoskeletal complaints other than usual "aches and pains".     PHYSICAL EXAM BP 141/83  Pulse 64  Resp 20  Ht 5\' 7"  (1.702 m)  Wt 187 lb 14.4 oz (85.231 kg)  BMI 29.42 kg/m2 Blood pressure is 50 mm Hg lower in the left brachial  General: Alert, oriented x3, no distress  Head: no evidence of trauma, PERRL, EOMI, no exophtalmos or lid lag, no myxedema, no xanthelasma; normal ears, nose and oropharynx  Neck: normal jugular venous pulsations and no hepatojugular reflux; brisk carotid pulses without delay and no carotid bruits  Chest: clear to auscultation, no signs of consolidation by percussion or palpation, normal fremitus, symmetrical and full respiratory excursions; healed sternotomy scar  Cardiovascular: normal position and quality of the apical impulse, regular rhythm, normal first and second heart sounds, no murmurs, rubs or gallops  Abdomen: no tenderness or distention, no masses by palpation, no abnormal pulsatility or arterial bruits, normal bowel sounds, no hepatosplenomegaly  Extremities: no clubbing, cyanosis or edema; 2+ radial, ulnar and brachial pulses in the right upper extremity, absent pulses in the left upper extremity; 2+ right femoral, posterior tibial and dorsalis pedis pulses; 2+ left femoral, posterior tibial and dorsalis pedis pulses; no subclavian or femoral bruits  Neurological: grossly nonfocal  EKG:  Sinus bradycardia, nonspecific IVCD (QRS 126 ms) probable left ventricular hypertrophy, ST segment depression T wave inversion in leads V4 throughV6-II-III and aVF. QTC 435 ms. The repolarization abnormalities are similar to those seen last year and at his last visit.   Lipid Panel     Component Value Date/Time   CHOL  Value: 188        ATP III CLASSIFICATION:  <200     mg/dL   Desirable  161-096  mg/dL   Borderline High  >=045    mg/dL   High 05/02/8117 1478   TRIG 170* 08/24/2007 0320   HDL 28* 08/24/2007 0320   CHOLHDL 6.7 08/24/2007 0320   VLDL 34 08/24/2007 0320   LDLCALC  Value: 126        Total Cholesterol/HDL:CHD Risk Coronary Heart Disease Risk Table                     Men   Women  1/2 Average  Risk   3.4   3.3* 08/24/2007 0320    BMET    Component Value Date/Time   NA 139 08/28/2007 0420   K 3.9 08/28/2007 0420   CL 106 08/28/2007 0420   CO2 28 08/28/2007 0420   GLUCOSE 86 08/28/2007 0420   BUN 11 08/28/2007 0420   CREATININE 0.96 08/28/2007 0420   CALCIUM 8.0* 08/28/2007 0420   GFRNONAA >60 08/28/2007 0420   GFRAA  Value: >60        The eGFR has been calculated using the MDRD equation. This calculation has not been validated in all clinical 08/28/2007 0420     ASSESSMENT AND PLAN CAD s/p CABG 2009 We'll continue with medical therapy for what has become infrequent angina pectoris. Complete smoking cessation may be highly beneficial to prevent angina. He is known to have inferolateral ischemia and has lost the SVG to ramus intermedius but did not have any good options for percutaneous revascularization at the time of cardiac catheterization in May of 2013. He has reached the "donut hole" because of the high cost of Chantix and Ranexa. We'll try to roll him in the Ranexa assistance program  HTN (hypertension) Blood pressure control is borderline. I am reluctant to increase his medications because of previous problems with symptomatic orthostatic hypotension  PAD - chronic total occlusion of the  left subclavian artery He has some symptoms of tingling in the left arm but not clear symptoms of claudication. Revascularization does not appear to be indicated.  Tobacco abuse I encouraged him to continue his efforts to quit smoking altogether. Rx for an additional 3 months of Chantix.  Hyperlipidemia Time to recheck his lipids   Orders Placed This Encounter  Procedures  . EKG 12-Lead   Meds ordered this encounter  Medications  . varenicline (CHANTIX) 1 MG tablet    Sig: Take 1 tablet (1 mg total) by mouth daily.    Dispense:  30 tablet    Refill:  2    Ymani Porcher  Thurmon Fair, MD, [facility] [phone] office [phone] pager

## 2012-12-11 NOTE — Assessment & Plan Note (Signed)
We'll continue with medical therapy for what has become infrequent angina pectoris. Complete smoking cessation may be highly beneficial to prevent angina. He is known to have inferolateral ischemia and has lost the SVG to ramus intermedius but did not have any good options for percutaneous revascularization at the time of cardiac catheterization in May of 2013. He has reached the "donut hole" because of the high cost of Chantix and Ranexa. We'll try to roll him in the Ranexa assistance program

## 2012-12-11 NOTE — Assessment & Plan Note (Signed)
He has some symptoms of tingling in the left arm but not clear symptoms of claudication. Revascularization does not appear to be indicated.

## 2012-12-11 NOTE — Assessment & Plan Note (Signed)
Time to recheck his lipids

## 2012-12-11 NOTE — Patient Instructions (Addendum)
A new Rx has been sent to your pharmacy for Chantix.  Your physician recommends that you schedule a follow-up appointment in: 6 months.

## 2012-12-11 NOTE — Telephone Encounter (Signed)
Maintenance dose of Chantix called in 1mg  bid #60 w/2 refills.

## 2012-12-11 NOTE — Assessment & Plan Note (Signed)
I encouraged him to continue his efforts to quit smoking altogether. Rx for an additional 3 months of Chantix.

## 2012-12-11 NOTE — Telephone Encounter (Signed)
Forwarded to B. Lassiter, CMA.  

## 2012-12-11 NOTE — Assessment & Plan Note (Signed)
Blood pressure control is borderline. I am reluctant to increase his medications because of previous problems with symptomatic orthostatic hypotension

## 2012-12-25 LAB — LIPID PANEL
Cholesterol: 186 mg/dL (ref 0–200)
HDL: 47 mg/dL (ref >39)
Total CHOL/HDL Ratio: 4 Ratio
Triglycerides: 206 mg/dL — ABNORMAL HIGH (ref <150)

## 2013-01-31 ENCOUNTER — Other Ambulatory Visit: Payer: Self-pay | Admitting: Cardiovascular Disease

## 2013-01-31 NOTE — Telephone Encounter (Signed)
Rx was sent to pharmacy electronically. 

## 2013-02-04 ENCOUNTER — Other Ambulatory Visit: Payer: Self-pay | Admitting: Internal Medicine

## 2013-02-04 NOTE — Telephone Encounter (Deleted)
Rx was sent to pharmacy electronically. 

## 2013-02-05 NOTE — Telephone Encounter (Signed)
Please advise? Omeprazole/Clopidogrel drug interaction

## 2013-02-06 NOTE — Telephone Encounter (Signed)
Will try to reach pt.  Profile states on Zegrid, need to advise pt to not take, we can give protonix if necessary.

## 2013-02-07 ENCOUNTER — Other Ambulatory Visit: Payer: Self-pay | Admitting: Cardiovascular Disease

## 2013-02-07 MED ORDER — PANTOPRAZOLE SODIUM 40 MG PO TBEC: 40.0000 mg | DELAYED_RELEASE_TABLET | Freq: Every day | ORAL | Status: DC

## 2013-02-07 NOTE — Telephone Encounter (Signed)
Rx was sent to pharmacy electronically. 

## 2013-02-07 NOTE — Telephone Encounter (Signed)
Spoke with patient, explained interaction of clopidogrel and omeprazole.  Offered to switch pt to pantoprazole.  Pt states had taken that once before without problems.  Rx for both clopidogrel and pantoprazole sent to pharmacy

## 2013-02-28 ENCOUNTER — Telehealth (HOSPITAL_COMMUNITY): Payer: Self-pay | Admitting: *Deleted

## 2013-03-01 ENCOUNTER — Other Ambulatory Visit (HOSPITAL_COMMUNITY): Payer: Self-pay | Admitting: Cardiovascular Disease

## 2013-03-01 DIAGNOSIS — I6529 Occlusion and stenosis of unspecified carotid artery: Secondary | ICD-10-CM

## 2013-03-06 ENCOUNTER — Encounter (HOSPITAL_COMMUNITY): Payer: Medicare Other

## 2013-03-12 ENCOUNTER — Other Ambulatory Visit (HOSPITAL_COMMUNITY): Payer: Self-pay | Admitting: Cardiovascular Disease

## 2013-03-18 ENCOUNTER — Other Ambulatory Visit (HOSPITAL_COMMUNITY): Payer: Self-pay | Admitting: Cardiovascular Disease

## 2013-03-19 ENCOUNTER — Encounter (HOSPITAL_COMMUNITY): Payer: Medicare Other

## 2013-03-22 ENCOUNTER — Encounter (HOSPITAL_COMMUNITY): Payer: Medicare Other

## 2013-03-25 ENCOUNTER — Ambulatory Visit (HOSPITAL_COMMUNITY)
Admission: RE | Admit: 2013-03-25 | Discharge: 2013-03-25 | Disposition: A | Discharge status: Home / Self Care | Payer: Medicare PPO | Source: Ambulatory Visit | Attending: Cardiovascular Disease | Admitting: Cardiovascular Disease

## 2013-03-25 DIAGNOSIS — I6529 Occlusion and stenosis of unspecified carotid artery: Secondary | ICD-10-CM | POA: Insufficient documentation

## 2013-03-25 NOTE — Progress Notes (Signed)
Carotid Duplex Completed. [name], BS, RDMS, RVT  

## 2013-03-28 ENCOUNTER — Telehealth: Payer: Self-pay | Admitting: *Deleted

## 2013-03-28 DIAGNOSIS — I6529 Occlusion and stenosis of unspecified carotid artery: Secondary | ICD-10-CM

## 2013-03-28 NOTE — Telephone Encounter (Signed)
Carotid doppler results called to patient.  Voiced understanding.  Repeat in 6 months.  Order placed.

## 2013-03-28 NOTE — Telephone Encounter (Signed)
Message copied by Vita BarleyLASSITER, Zoye Chandra A on Thu Mar 28, 2013  4:38 PM ------      Message from: Thurmon FairROITORU, MIHAI      Created: Thu Mar 28, 2013  2:17 PM       Right carotid stenosis is tighter - please recheck in 6 mos. ------

## 2013-06-15 ENCOUNTER — Other Ambulatory Visit: Payer: Self-pay | Admitting: Cardiovascular Disease

## 2013-06-15 NOTE — Telephone Encounter (Signed)
Rx was sent to pharmacy electronically. 

## 2013-07-09 ENCOUNTER — Other Ambulatory Visit: Payer: Self-pay | Admitting: Cardiovascular Disease

## 2013-07-09 NOTE — Telephone Encounter (Signed)
Rx was sent to pharmacy electronically. 

## 2013-08-10 ENCOUNTER — Observation Stay (HOSPITAL_COMMUNITY)
Admission: EM | Admit: 2013-08-10 | Discharge: 2013-08-11 | Disposition: A | Discharge status: Home / Self Care | Payer: Medicare HMO | Attending: Family Medicine | Admitting: Family Medicine

## 2013-08-10 ENCOUNTER — Emergency Department (HOSPITAL_COMMUNITY): Payer: Medicare HMO

## 2013-08-10 ENCOUNTER — Encounter (HOSPITAL_COMMUNITY): Payer: Self-pay | Admitting: Emergency Medicine

## 2013-08-10 DIAGNOSIS — Z720 Tobacco use: Secondary | ICD-10-CM

## 2013-08-10 DIAGNOSIS — R9431 Abnormal electrocardiogram [ECG] [EKG]: Secondary | ICD-10-CM

## 2013-08-10 DIAGNOSIS — E785 Hyperlipidemia, unspecified: Secondary | ICD-10-CM | POA: Insufficient documentation

## 2013-08-10 DIAGNOSIS — R55 Syncope and collapse: Principal | ICD-10-CM | POA: Insufficient documentation

## 2013-08-10 DIAGNOSIS — I2510 Atherosclerotic heart disease of native coronary artery without angina pectoris: Secondary | ICD-10-CM | POA: Insufficient documentation

## 2013-08-10 DIAGNOSIS — Z7902 Long term (current) use of antithrombotics/antiplatelets: Secondary | ICD-10-CM | POA: Insufficient documentation

## 2013-08-10 DIAGNOSIS — Z951 Presence of aortocoronary bypass graft: Secondary | ICD-10-CM | POA: Insufficient documentation

## 2013-08-10 DIAGNOSIS — I739 Peripheral vascular disease, unspecified: Secondary | ICD-10-CM | POA: Insufficient documentation

## 2013-08-10 DIAGNOSIS — I25810 Atherosclerosis of coronary artery bypass graft(s) without angina pectoris: Secondary | ICD-10-CM

## 2013-08-10 DIAGNOSIS — F17200 Nicotine dependence, unspecified, uncomplicated: Secondary | ICD-10-CM | POA: Insufficient documentation

## 2013-08-10 DIAGNOSIS — R6883 Chills (without fever): Secondary | ICD-10-CM | POA: Insufficient documentation

## 2013-08-10 DIAGNOSIS — Z7982 Long term (current) use of aspirin: Secondary | ICD-10-CM | POA: Insufficient documentation

## 2013-08-10 DIAGNOSIS — Z79899 Other long term (current) drug therapy: Secondary | ICD-10-CM | POA: Insufficient documentation

## 2013-08-10 DIAGNOSIS — I10 Essential (primary) hypertension: Secondary | ICD-10-CM | POA: Insufficient documentation

## 2013-08-10 DIAGNOSIS — R112 Nausea with vomiting, unspecified: Secondary | ICD-10-CM | POA: Insufficient documentation

## 2013-08-10 LAB — COMPREHENSIVE METABOLIC PANEL
ALK PHOS: 57 U/L (ref 39–117)
ALT: 8 U/L (ref 0–53)
AST: 15 U/L (ref 0–37)
Albumin: 3.5 g/dL (ref 3.5–5.2)
Anion gap: 14 (ref 5–15)
BUN: 14 mg/dL (ref 6–23)
CO2: 22 meq/L (ref 19–32)
Calcium: 9.1 mg/dL (ref 8.4–10.5)
Chloride: 103 mEq/L (ref 96–112)
Creatinine, Ser: 1.2 mg/dL (ref 0.50–1.35)
GFR calc Af Amer: 72 mL/min — ABNORMAL LOW (ref >90)
GFR calc non Af Amer: 62 mL/min — ABNORMAL LOW (ref >90)
Glucose, Bld: 119 mg/dL — ABNORMAL HIGH (ref 70–99)
POTASSIUM: 3.3 meq/L — AB (ref 3.7–5.3)
SODIUM: 139 meq/L (ref 137–147)
Total Bilirubin: 0.6 mg/dL (ref 0.3–1.2)
Total Protein: 6.6 g/dL (ref 6.0–8.3)

## 2013-08-10 LAB — CBC WITH DIFFERENTIAL/PLATELET
Basophils Absolute: 0 10*3/uL (ref 0.0–0.1)
Basophils Relative: 0 % (ref 0–1)
Eosinophils Absolute: 0.1 10*3/uL (ref 0.0–0.7)
Eosinophils Relative: 2 % (ref 0–5)
HCT: 34.3 % — ABNORMAL LOW (ref 39.0–52.0)
HEMOGLOBIN: 12.2 g/dL — AB (ref 13.0–17.0)
LYMPHS ABS: 1.8 10*3/uL (ref 0.7–4.0)
LYMPHS PCT: 36 % (ref 12–46)
MCH: 35.1 pg — AB (ref 26.0–34.0)
MCHC: 35.6 g/dL (ref 30.0–36.0)
MCV: 98.6 fL (ref 78.0–100.0)
Monocytes Absolute: 0.3 10*3/uL (ref 0.1–1.0)
Monocytes Relative: 6 % (ref 3–12)
NEUTROS ABS: 2.9 10*3/uL (ref 1.7–7.7)
Neutrophils Relative %: 56 % (ref 43–77)
PLATELETS: 151 10*3/uL (ref 150–400)
RBC: 3.48 MIL/uL — AB (ref 4.22–5.81)
RDW: 13.5 % (ref 11.5–15.5)
WBC: 5.1 10*3/uL (ref 4.0–10.5)

## 2013-08-10 LAB — URINALYSIS, ROUTINE W REFLEX MICROSCOPIC
BILIRUBIN URINE: NEGATIVE
Glucose, UA: NEGATIVE mg/dL
HGB URINE DIPSTICK: NEGATIVE
Leukocytes, UA: NEGATIVE
Nitrite: NEGATIVE
Protein, ur: NEGATIVE mg/dL
SPECIFIC GRAVITY, URINE: 1.02 (ref 1.005–1.030)
UROBILINOGEN UA: 0.2 mg/dL (ref 0.0–1.0)
pH: 6.5 (ref 5.0–8.0)

## 2013-08-10 LAB — PRO B NATRIURETIC PEPTIDE: PRO B NATRI PEPTIDE: 2399 pg/mL — AB (ref 0–125)

## 2013-08-10 LAB — TROPONIN I: Troponin I: <0.3 ng/mL (ref <0.30)

## 2013-08-10 MED ORDER — ONDANSETRON HCL 4 MG/2ML IJ SOLN: 4.0000 mg | Freq: Four times a day (QID) | INTRAMUSCULAR | Status: DC | PRN

## 2013-08-10 MED ORDER — HEPARIN SODIUM (PORCINE) 5000 UNIT/ML IJ SOLN
5000.0000 [IU] | Freq: Three times a day (TID) | INTRAMUSCULAR | Status: DC
  Administered 2013-08-10: 5000 [IU] via SUBCUTANEOUS
  Filled 2013-08-10 (×2): qty 1

## 2013-08-10 MED ORDER — CARVEDILOL 3.125 MG PO TABS
3.1250 mg | ORAL_TABLET | Freq: Two times a day (BID) | ORAL | Status: DC
  Administered 2013-08-11: 3.125 mg via ORAL
  Filled 2013-08-10: qty 1

## 2013-08-10 MED ORDER — SODIUM CHLORIDE 0.9 % IV BOLUS (SEPSIS)
1000.0000 mL | Freq: Once | INTRAVENOUS | Status: AC
  Administered 2013-08-10: 1000 mL via INTRAVENOUS

## 2013-08-10 MED ORDER — SODIUM CHLORIDE 0.9 % IV BOLUS (SEPSIS)
250.0000 mL | Freq: Once | INTRAVENOUS | Status: AC
  Administered 2013-08-10: 250 mL via INTRAVENOUS

## 2013-08-10 MED ORDER — PANTOPRAZOLE SODIUM 40 MG PO TBEC
40.0000 mg | DELAYED_RELEASE_TABLET | Freq: Every day | ORAL | Status: DC
Start: 2013-08-10 — End: 2013-08-11
  Administered 2013-08-11: 40 mg via ORAL
  Filled 2013-08-10 (×2): qty 1

## 2013-08-10 MED ORDER — PANTOPRAZOLE SODIUM 40 MG PO TBEC: 40.0000 mg | DELAYED_RELEASE_TABLET | Freq: Every day | ORAL | Status: DC

## 2013-08-10 MED ORDER — SODIUM CHLORIDE 0.9 % IJ SOLN
3.0000 mL | Freq: Two times a day (BID) | INTRAMUSCULAR | Status: DC
  Administered 2013-08-10: 3 mL via INTRAVENOUS

## 2013-08-10 MED ORDER — RANOLAZINE ER 500 MG PO TB12
ORAL_TABLET | ORAL | Status: AC
  Filled 2013-08-10: qty 2

## 2013-08-10 MED ORDER — CLOPIDOGREL BISULFATE 75 MG PO TABS
75.0000 mg | ORAL_TABLET | Freq: Every day | ORAL | Status: DC
  Administered 2013-08-11: 75 mg via ORAL
  Filled 2013-08-10: qty 1

## 2013-08-10 MED ORDER — ONDANSETRON HCL 4 MG PO TABS: 4.0000 mg | ORAL_TABLET | Freq: Four times a day (QID) | ORAL | Status: DC | PRN

## 2013-08-10 MED ORDER — SODIUM CHLORIDE 0.9 % IV SOLN: INTRAVENOUS | Status: DC

## 2013-08-10 MED ORDER — ISOSORBIDE MONONITRATE ER 60 MG PO TB24
30.0000 mg | ORAL_TABLET | Freq: Every day | ORAL | Status: DC
  Administered 2013-08-10: 30 mg via ORAL
  Filled 2013-08-10: qty 1

## 2013-08-10 MED ORDER — RANOLAZINE ER 500 MG PO TB12
1000.0000 mg | ORAL_TABLET | Freq: Two times a day (BID) | ORAL | Status: DC
  Administered 2013-08-10 – 2013-08-11 (×2): 1000 mg via ORAL
  Filled 2013-08-10 (×4): qty 2

## 2013-08-10 MED ORDER — LISINOPRIL 5 MG PO TABS
5.0000 mg | ORAL_TABLET | Freq: Every day | ORAL | Status: DC
  Administered 2013-08-11: 5 mg via ORAL
  Filled 2013-08-10: qty 1

## 2013-08-10 MED ORDER — SIMVASTATIN 20 MG PO TABS: 20.0000 mg | ORAL_TABLET | Freq: Every day | ORAL | Status: DC

## 2013-08-10 MED ORDER — SODIUM CHLORIDE 0.9 % IV SOLN
INTRAVENOUS | Status: DC
  Administered 2013-08-10: 22:00:00 via INTRAVENOUS

## 2013-08-10 NOTE — ED Notes (Addendum)
Pt. Reports sitting at pool drinking a beer, felt faint, then woke up vomiting, denies having CP prior to syncopal episode, vomited twice, witnessed by EMS, CBG 161 in route by EMS

## 2013-08-10 NOTE — ED Provider Notes (Signed)
CSN: [id number]     Arrival date & time 08/10/13  1414 History   First MD Initiated Contact with Patient 08/10/13 1438     This chart was scribed for Vanetta MuldersScott Kapri Nero, MD by Arlan OrganAshley Leger, ED Scribe. This patient was seen in room APA09/APA09 and the patient's care was started 2:54 PM.   Chief Complaint  Patient presents with  . Loss of Consciousness   Patient is a 68 y.o. male presenting with syncope. The history is provided by the patient and a relative. No language interpreter was used.  Loss of Consciousness Episode history:  Single Most recent episode:  Today Progression:  Resolved Chronicity:  New Witnessed: yes   Associated symptoms: nausea and vomiting   Associated symptoms: no chest pain, no confusion, no fever, no headaches and no shortness of breath     HPI Comments: [name] brought in by ambulance is a 68 y.o. Male with s  PMHx of CAD, HTN, hyperlipidemia, and PAD who presents to the Emergency Department complaining of loss of consciousness onset prior to arrival. Last known well this morning. Relative states they went for a game Golf early this morning around 9 AM. Pt then walked over to the pool afterwards to relax. States he passed out while at the pool and someone around him called EMS. No head trauma. Last episode of LOC 3-4 years ago from unknown cause. At this time he denies any fever, chills, diarrhea, abdominal pain, CP, SOB, or rash. Pt had open heart surgery in 2009. He denies any pacemaker placement. Oxygen at triage was 90% and bordering between 89-90% on 2 liters in pt room. No known allergies to medications. No other concerns this visit.   He is followed by Dr. Elfredia NevinsLawrence Fusco PCP  Past Medical History  Diagnosis Date  . CAD s/p CABG 2009 10/31/2012    Free LIMA to LAD (left subclavian artery occlusion), SVG to PDA, SVG to oblique marginal, SVG to ramus intermedius. Previous stents in the LAD artery and left circumflex coronary artery   . HTN (hypertension)  10/31/2012  . Hyperlipidemia 10/31/2012  . 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 Coronary artery bypass graft  2009   History reviewed. No pertinent family history. History  Substance Use Topics  . Smoking status: Current Some Day Smoker -- 0.10 packs/day    Types: Cigarettes  . Smokeless tobacco: Not on file  . Alcohol Use: Yes     Comment: occas.    Review of Systems  Constitutional: Positive for chills. Negative for fever.  HENT: Negative for rhinorrhea and sore throat.   Eyes: Negative for visual disturbance.  Respiratory: Negative for cough and shortness of breath.   Cardiovascular: Positive for syncope. Negative for chest pain.  Gastrointestinal: Positive for nausea and vomiting. Negative for abdominal pain and diarrhea.  Genitourinary: Negative for dysuria.  Musculoskeletal: Negative for back pain and joint swelling.  Skin: Negative for rash.  Neurological: Positive for syncope. Negative for headaches.  Hematological: Does not bruise/bleed easily.  Psychiatric/Behavioral: Negative for confusion.      Allergies  Review of patient's allergies indicates no known allergies.  Home Medications   Prior to Admission medications   Medication Sig Start Date End Date Taking? Authorizing Provider  aspirin EC 81 MG tablet Take 81 mg by mouth daily.   Yes Historical Provider, MD  calcium carbonate (TUMS - DOSED IN MG ELEMENTAL CALCIUM) 500  MG chewable tablet Chew 1 tablet by mouth 3 (three) times daily as needed. For acid stomach   Yes Historical Provider, MD  carvedilol (COREG) 3.125 MG tablet TAKE ONE TABLET BY MOUTH TWICE A DAY 06/15/13  Yes Mihai Croitoru, MD  clopidogrel (PLAVIX) 75 MG tablet TAKE ONE (1) TABLET EACH DAY 02/04/13  Yes Mihai Croitoru, MD  isosorbide mononitrate (IMDUR) 30 MG 24 hr tablet TAKE ONE TABLET ONE HOUR BEFORE BEDTIME 07/09/13  Yes Mihai Croitoru, MD  lisinopril  (PRINIVIL,ZESTRIL) 5 MG tablet TAKE ONE TABLET ONCE DAILY 01/31/13  Yes Mihai Croitoru, MD  nitroGLYCERIN (NITROSTAT) 0.4 MG SL tablet Place 1 tablet (0.4 mg total) under the tongue every 5 (five) minutes as needed for chest pain. 10/30/12  Yes Mihai Croitoru, MD  Omega-3 Fatty Acids (FISH OIL) 1000 MG CAPS Take 1 capsule by mouth daily.   Yes Historical Provider, MD  pantoprazole (PROTONIX) 40 MG tablet Take 1 tablet (40 mg total) by mouth daily. 02/07/13  Yes Mihai Croitoru, MD  ranolazine (RANEXA) 1000 MG SR tablet Take 1 tablet (1,000 mg total) by mouth 2 (two) times daily. 12/11/12  Yes Mihai Croitoru, MD  simvastatin (ZOCOR) 20 MG tablet TAKE ONE TABLET EVERY EVENING 03/18/13  Yes Mihai Croitoru, MD   Triage Vitals: BP 122/69  Pulse 68  Temp(Src) 97.4 F (36.3 C) (Oral)  Resp 17  SpO2 90%   Physical Exam  Nursing note and vitals reviewed. Constitutional: He is oriented to person, place, and time. He appears well-developed and well-nourished.  HENT:  Head: Normocephalic and atraumatic.  Eyes: EOM are normal.  Neck: Normal range of motion.  Cardiovascular: Normal rate, regular rhythm, normal heart sounds and intact distal pulses.   Pulses:      Radial pulses are 2+ on the right side.  Pulmonary/Chest: Effort normal and breath sounds normal. No respiratory distress.  Abdominal: Soft. Bowel sounds are normal. There is no tenderness.  Musculoskeletal: Normal range of motion. He exhibits no edema.  No swelling to ankles bilaterally  Neurological: He is alert and oriented to person, place, and time. No cranial nerve deficit. He exhibits normal muscle tone. Coordination normal.  Skin: Skin is warm and dry.  Psychiatric: He has a normal mood and affect. Judgment normal.    ED Course  Procedures (including critical care time)  DIAGNOSTIC STUDIES: Oxygen Saturation is 90% on RA, low by my interpretation.    COORDINATION OF CARE: 2:59 PM- Will give fluids. Wil order DG Chest port 1 view,  BNP, Troponin I, CBC, CMP, and urinalysis. Discussed treatment plan with pt at bedside and pt agreed to plan.     Labs Review Labs Reviewed  CBC WITH DIFFERENTIAL - Abnormal; Notable for the following:    RBC 3.48 (*)    Hemoglobin 12.2 (*)    HCT 34.3 (*)    MCH 35.1 (*)    All other components within normal limits  COMPREHENSIVE METABOLIC PANEL - Abnormal; Notable for the following:    Potassium 3.3 (*)    Glucose, Bld 119 (*)    GFR calc non Af Amer 62 (*)    GFR calc Af Amer 72 (*)    All other components within normal limits  PRO B NATRIURETIC PEPTIDE - Abnormal; Notable for the following:    Pro B Natriuretic peptide (BNP) 2399.0 (*)    All other components within normal limits  TROPONIN I  URINALYSIS, ROUTINE W REFLEX MICROSCOPIC    Imaging Review Dg Chest Port 1  View  08/10/2013   CLINICAL DATA:  Syncope.  EXAM: PORTABLE CHEST - 1 VIEW  COMPARISON:  September 17, 2012.  FINDINGS: The heart size and mediastinal contours are within normal limits. Both lungs are clear. Sternotomy wires are noted. No pneumothorax or pleural effusion is noted. Left glenohumeral joint degenerative disease is noted.  IMPRESSION: No acute cardiopulmonary abnormality seen.   Electronically Signed   By: Roque Lias M.D.   On: 08/10/2013 15:03     EKG Interpretation   Date/Time:  Saturday August 10 2013 14:18:13 EDT Ventricular Rate:  69 PR Interval:  185 QRS Duration: 144 QT Interval:  469 QTC Calculation: 502 R Axis:   46 Text Interpretation:  Sinus rhythm LVH with IVCD and secondary repol abnrm  Prolonged QT interval No significant change since last tracing Confirmed  by Merie Wulf  MD, Nayelly Laughman [phone]) on 08/10/2013 3:10:04 PM      MDM   Final diagnoses:  Syncope, unspecified syncope type    The patient with unexplained syncope. No real bad feelings right prior and he felt he was going to faint. Patient was out golfing in the heat prior to this. But he was sitting in his stool at pool side  when this occurred. Patient with no chest pain no shortness of breath. Workup here initially EKG without acute changes but does have a prolonged QT interval. Troponin negative chest x-ray negative for pulmonary edema pneumothorax or pneumonia. No significant anemia. BNP is elevated at 2399 however no comparison except For many years ago. Patient will require admission for cardiac monitoring.  I personally performed the services described in this documentation, which was scribed in my presence. The recorded information has been reviewed and is accurate.    Vanetta Mulders, MD 08/10/13 [phone]

## 2013-08-10 NOTE — ED Notes (Signed)
Pt. Does not want to be admitted, EDP aware, will talk with pt.

## 2013-08-10 NOTE — H&P (Signed)
[facility] Admission History and Physical   Patient name: [name] Medical record number: [id number] Date of birth: 03-Apr-1945 Age: 68 y.o. Gender: male  Primary Care Provider: Cassell Smiles., MD Consultants: none Code Status: DNR  Chief Complaint: syncope  Assessment and Plan: [name] is a 68 y.o. male presenting with syncope x1. PMH is significant for CAD s/p CABG, PAD, HLD, HTN, tobacco abuse  Syncope: likely vasovagal episode secondary to dehydration and exertion on a hot day. Pt sitting at time of event. Pt feeling near baseline a time of examination. Unlikely cardiac or neurologic or electrolyte etiology. Carrotid dopplers done 36mo ago and no significant stenosis noted.  - Admit to Tele for Obs - NS 1 L bolus - orthostatics  CV: normotensive on exam. H/o HTN apparently well controlled. ProBNP of 2399 on admission. This meets criteria for CHF but unlikely given lack of other symptoms (no LE edema, orthopnea, SOB, CP, wt gain, weakness). Pt w/ risk factors including age, CAD/CABG, HLD, HTN. Renal function nml., and troponin neg.  - Echo w/ contrast ordered (OK to get as outpt as pt very anxious about getting home). - No diuresis at this time.  - Hold home - continue home Coreg, imdur, lisinopril, ranexa - continue plavix - continue statin - continue ASA  FEN/GI: tolerating PO - heart healthy diet.   Prophylaxis:  [employer] Hep TID  Disposition: pending clinical improvement.  History of Present Illness: [name] is a 68 y.o. male presenting with syncope. Played 18 holes of golf, went to the pool to relax. Sat in chair and took a few sips of a beer then passed out. Felt lightheaded adn worn out just prior to syncope. Pt felt very hot and humid while playing today. Emesis after waking up. Witnessed episode - no head trauma, no szr like activity. Denies fevers, CP, SOB, palpitations, rash, HA. Denies any orthopnea or DOE at baseline. In normal  state of health prior to this event. Continues to smoke intermittently  Review Of Systems: Per HPI with the following additions: none Otherwise 12 point review of systems was performed and was unremarkable.  Patient Active Problem List   Diagnosis Date Noted  . Syncope 08/10/2013  . CAD s/p CABG 2009 10/31/2012  . 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 Hyperlipidemia 10/31/2012  . HTN (hypertension) 10/31/2012  . Tobacco abuse 10/31/2012   Past Medical History: Past Medical History  Diagnosis Date  . CAD s/p CABG 2009 10/31/2012    Free LIMA to LAD (left subclavian artery occlusion), SVG to PDA, SVG to oblique marginal, SVG to ramus intermedius. Previous stents in the LAD artery and left circumflex coronary artery   . HTN (hypertension) 10/31/2012  . Hyperlipidemia 10/31/2012  . 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 Coronary artery bypass graft  2009   Social History: History  Substance Use Topics  . Smoking status: Current Some Day Smoker -- 0.10 packs/day    Types: Cigarettes  . Smokeless tobacco: Not on file  . Alcohol Use: Yes     Comment: occas.   Additional social history: Reviewed Please also refer to relevant sections of EMR.  Family History: History reviewed. No pertinent family history. Allergies and Medications: No Known Allergies No current facility-administered medications on file prior to encounter.   Current Outpatient Prescriptions on File Prior to  Encounter  Medication Sig Dispense Refill  . aspirin EC 81 MG tablet Take 81 mg by mouth daily.      . calcium carbonate (TUMS - DOSED IN MG ELEMENTAL CALCIUM) 500 MG chewable tablet Chew 1 tablet by mouth 3 (three) times daily as needed. For acid stomach      . carvedilol (COREG) 3.125 MG tablet TAKE ONE TABLET BY MOUTH TWICE A DAY  60 tablet  5  . clopidogrel  (PLAVIX) 75 MG tablet TAKE ONE (1) TABLET EACH DAY  30 tablet  10  . isosorbide mononitrate (IMDUR) 30 MG 24 hr tablet TAKE ONE TABLET ONE HOUR BEFORE BEDTIME  30 tablet  7  . lisinopril (PRINIVIL,ZESTRIL) 5 MG tablet TAKE ONE TABLET ONCE DAILY  30 tablet  11  . nitroGLYCERIN (NITROSTAT) 0.4 MG SL tablet Place 1 tablet (0.4 mg total) under the tongue every 5 (five) minutes as needed for chest pain.  25 tablet  6  . Omega-3 Fatty Acids (FISH OIL) 1000 MG CAPS Take 1 capsule by mouth daily.      . pantoprazole (PROTONIX) 40 MG tablet Take 1 tablet (40 mg total) by mouth daily.  30 tablet  10  . ranolazine (RANEXA) 1000 MG SR tablet Take 1 tablet (1,000 mg total) by mouth 2 (two) times daily.  84 each  0  . simvastatin (ZOCOR) 20 MG tablet TAKE ONE TABLET EVERY EVENING  30 tablet  5    Objective: BP 122/69  Pulse 68  Temp(Src) 97.4 F (36.3 C) (Oral)  Resp 17  SpO2 90% Exam: General: resting in bed. NAD  HEENT: dry mm, EOMI, PERRL Cardiovascular: RRR, distant heart sounds, no m/r/g Respiratory: CTAB, no ronchi or crackles, nml effort Abdomen: NABS, soft non-ttp Extremities: 2+ puleses, no LE edema Skin: no rash, intact Neuro: CN 2-12 grossly intact.   Labs and Imaging: Results for orders placed during the hospital encounter of 08/10/13 (from the past 24 hour(s))  PRO B NATRIURETIC PEPTIDE     Status: Abnormal   Collection Time    08/10/13  2:39 PM      Result Value Ref Range   Pro B Natriuretic peptide (BNP) 2399.0 (*) 0 - 125 pg/mL  CBC WITH DIFFERENTIAL     Status: Abnormal   Collection Time    08/10/13  2:42 PM      Result Value Ref Range   WBC 5.1  4.0 - 10.5 K/uL   RBC 3.48 (*) 4.22 - 5.81 MIL/uL   Hemoglobin 12.2 (*) 13.0 - 17.0 g/dL   HCT 16.1 (*) 09.6 - 04.5 %   MCV 98.6  78.0 - 100.0 fL   MCH 35.1 (*) 26.0 - 34.0 pg   MCHC 35.6  30.0 - 36.0 g/dL   RDW 40.9  81.1 - 91.4 %   Platelets 151  150 - 400 K/uL   Neutrophils Relative % 56  43 - 77 %   Neutro Abs 2.9  1.7  - 7.7 K/uL   Lymphocytes Relative 36  12 - 46 %   Lymphs Abs 1.8  0.7 - 4.0 K/uL   Monocytes Relative 6  3 - 12 %   Monocytes Absolute 0.3  0.1 - 1.0 K/uL   Eosinophils Relative 2  0 - 5 %   Eosinophils Absolute 0.1  0.0 - 0.7 K/uL   Basophils Relative 0  0 - 1 %   Basophils Absolute 0.0  0.0 - 0.1 K/uL  COMPREHENSIVE METABOLIC PANEL  Status: Abnormal   Collection Time    08/10/13  2:42 PM      Result Value Ref Range   Sodium 139  137 - 147 mEq/L   Potassium 3.3 (*) 3.7 - 5.3 mEq/L   Chloride 103  96 - 112 mEq/L   CO2 22  19 - 32 mEq/L   Glucose, Bld 119 (*) 70 - 99 mg/dL   BUN 14  6 - 23 mg/dL   Creatinine, Ser 0.981.20  0.50 - 1.35 mg/dL   Calcium 9.1  8.4 - 11.910.5 mg/dL   Total Protein 6.6  6.0 - 8.3 g/dL   Albumin 3.5  3.5 - 5.2 g/dL   AST 15  0 - 37 U/L   ALT 8  0 - 53 U/L   Alkaline Phosphatase 57  39 - 117 U/L   Total Bilirubin 0.6  0.3 - 1.2 mg/dL   GFR calc non Af Amer 62 (*) >90 mL/min   GFR calc Af Amer 72 (*) >90 mL/min   Anion gap 14  5 - 15  TROPONIN I     Status: None   Collection Time    08/10/13  2:42 PM      Result Value Ref Range   Troponin I <0.30  <0.30 ng/mL    Dg Chest Port 1 View  08/10/2013   CLINICAL DATA:  Syncope.  EXAM: PORTABLE CHEST - 1 VIEW  COMPARISON:  September 17, 2012.  FINDINGS: The heart size and mediastinal contours are within normal limits. Both lungs are clear. Sternotomy wires are noted. No pneumothorax or pleural effusion is noted. Left glenohumeral joint degenerative disease is noted.  IMPRESSION: No acute cardiopulmonary abnormality seen.   Electronically Signed   By: Roque LiasJames  Green M.D.   On: 08/10/2013 15:03     Ozella Rocksavid J Vaibhav Fogleman, MD Family Medicine 08/10/2013, 5:23 PM [facility]

## 2013-08-11 DIAGNOSIS — R55 Syncope and collapse: Secondary | ICD-10-CM

## 2013-08-11 DIAGNOSIS — I10 Essential (primary) hypertension: Secondary | ICD-10-CM

## 2013-08-11 DIAGNOSIS — I059 Rheumatic mitral valve disease, unspecified: Secondary | ICD-10-CM

## 2013-08-11 DIAGNOSIS — R9431 Abnormal electrocardiogram [ECG] [EKG]: Secondary | ICD-10-CM

## 2013-08-11 LAB — BASIC METABOLIC PANEL
Anion gap: 8 (ref 5–15)
BUN: 16 mg/dL (ref 6–23)
CO2: 26 mEq/L (ref 19–32)
CREATININE: 1.14 mg/dL (ref 0.50–1.35)
Calcium: 8.1 mg/dL — ABNORMAL LOW (ref 8.4–10.5)
Chloride: 106 mEq/L (ref 96–112)
GFR calc Af Amer: 76 mL/min — ABNORMAL LOW (ref >90)
GFR, EST NON AFRICAN AMERICAN: 66 mL/min — AB (ref >90)
Glucose, Bld: 99 mg/dL (ref 70–99)
Potassium: 3.8 mEq/L (ref 3.7–5.3)
Sodium: 140 mEq/L (ref 137–147)

## 2013-08-11 LAB — TROPONIN I: Troponin I: <0.3 ng/mL (ref <0.30)

## 2013-08-11 NOTE — Progress Notes (Signed)
Patient discharged home with family.  IV removed - WNL. No changes to meds made.  Cardio to call and schedule for holter monitoring and ECHO results.  Patient states he sees [facility], though.  Instructed patient to have Victoria Vera make referral for holter monitoring if needed.  No questions at this time.  Stable to DC.  Ambulatory off unit with assist of RN.

## 2013-08-11 NOTE — Progress Notes (Signed)
Utilization review completed.  

## 2013-08-11 NOTE — Discharge Summary (Addendum)
Physician Discharge Summary  [name] ZOX:[id number] DOB: 11-27-1945 DOA: 08/10/2013  PCP: Cassell Smiles., MD  Admit date: 08/10/2013 Discharge date: 08/11/2013  Time spent: 50* minutes  Recommendations for Outpatient Follow-up:  1. *Follow up Cardiology for Holter monitoring  Discharge Diagnoses:  Active Problems:   Syncope   Discharge Condition: *Stable  Diet recommendation: *Low salt diet  Filed Weights   08/10/13 1818  Weight: 81.194 kg (179 lb)    History of present illness:  68 y.o. male presenting with syncope. Played 18 holes of golf, went to the pool to relax. Sat in chair and took a few sips of a beer then passed out. Felt lightheaded adn worn out just prior to syncope. Pt felt very hot and humid while playing today. Emesis after waking up. Witnessed episode - no head trauma, no szr like activity. Denies fevers, CP, SOB, palpitations, rash, HA. Denies any orthopnea or DOE at baseline. In normal state of health prior to this event. Continues to smoke intermittently      Hospital Course:   Syncope- ? Vasovagal, orthostatics negative, echocardiogram is done and the result is pending. Will need outpatient  Holter monitoring. Did not have any further episodes in the hospital.   CAD- stable. Continue with home medications.  Prolonged Qtc- Qtc is 502, will need outpatient Holter. Tele did not show any arrythmia in the hospital  Procedures:  None  Consultations:  None  Discharge Exam: Filed Vitals:   08/10/13 2258  BP: 129/67  Pulse: 69  Temp: 98.4 F (36.9 C)  Resp: 18    General: *Appear in no acute distress Cardiovascular: *S1s2 RRR Respiratory: *Clear bilaterally  Discharge Instructions You were cared for by a hospitalist during your hospital stay. If you have any questions about your discharge medications or the care you received while you were in the hospital after you are discharged, you can call the unit and asked to speak with the  hospitalist on call if the hospitalist that took care of you is not available. Once you are discharged, your primary care physician will handle any further medical issues. Please note that NO REFILLS for any discharge medications will be authorized once you are discharged, as it is imperative that you return to your primary care physician (or establish a relationship with a primary care physician if you do not have one) for your aftercare needs so that they can reassess your need for medications and monitor your lab values.  Discharge Instructions   Diet - low sodium heart healthy    Complete by:  As directed      Discharge instructions    Complete by:  As directed   Will need Holter monitoring as outpatient. Cardiology office will call you     Increase activity slowly    Complete by:  As directed             Medication List         aspirin EC 81 MG tablet  Take 81 mg by mouth daily.     calcium carbonate 500 MG chewable tablet  Commonly known as:  TUMS - dosed in mg elemental calcium  Chew 1 tablet by mouth 3 (three) times daily as needed. For acid stomach     carvedilol 3.125 MG tablet  Commonly known as:  COREG  TAKE ONE TABLET BY MOUTH TWICE A DAY     clopidogrel 75 MG tablet  Commonly known as:  PLAVIX  TAKE ONE (1) TABLET EACH DAY  Fish Oil 1000 MG Caps  Take 1 capsule by mouth daily.     isosorbide mononitrate 30 MG 24 hr tablet  Commonly known as:  IMDUR  TAKE ONE TABLET ONE HOUR BEFORE BEDTIME     lisinopril 5 MG tablet  Commonly known as:  PRINIVIL,ZESTRIL  TAKE ONE TABLET ONCE DAILY     nitroGLYCERIN 0.4 MG SL tablet  Commonly known as:  NITROSTAT  Place 1 tablet (0.4 mg total) under the tongue every 5 (five) minutes as needed for chest pain.     pantoprazole 40 MG tablet  Commonly known as:  PROTONIX  Take 1 tablet (40 mg total) by mouth daily.     ranolazine 1000 MG SR tablet  Commonly known as:  RANEXA  Take 1 tablet (1,000 mg total) by mouth 2  (two) times daily.     simvastatin 20 MG tablet  Commonly known as:  ZOCOR  TAKE ONE TABLET EVERY EVENING       No Known Allergies    The results of significant diagnostics from this hospitalization (including imaging, microbiology, ancillary and laboratory) are listed below for reference.    Significant Diagnostic Studies: Dg Chest Port 1 View  08/10/2013   CLINICAL DATA:  Syncope.  EXAM: PORTABLE CHEST - 1 VIEW  COMPARISON:  September 17, 2012.  FINDINGS: The heart size and mediastinal contours are within normal limits. Both lungs are clear. Sternotomy wires are noted. No pneumothorax or pleural effusion is noted. Left glenohumeral joint degenerative disease is noted.  IMPRESSION: No acute cardiopulmonary abnormality seen.   Electronically Signed   By: Roque LiasJames  Green M.D.   On: 08/10/2013 15:03    Microbiology: No results found for this or any previous visit (from the past 240 hour(s)).   Labs: Basic Metabolic Panel:  Recent Labs Lab 08/10/13 1442 08/11/13 0622  NA 139 140  K 3.3* 3.8  CL 103 106  CO2 22 26  GLUCOSE 119* 99  BUN 14 16  CREATININE 1.20 1.14  CALCIUM 9.1 8.1*   Liver Function Tests:  Recent Labs Lab 08/10/13 1442  AST 15  ALT 8  ALKPHOS 57  BILITOT 0.6  PROT 6.6  ALBUMIN 3.5   No results found for this basename: LIPASE, AMYLASE,  in the last 168 hours No results found for this basename: AMMONIA,  in the last 168 hours CBC:  Recent Labs Lab 08/10/13 1442  WBC 5.1  NEUTROABS 2.9  HGB 12.2*  HCT 34.3*  MCV 98.6  PLT 151   Cardiac Enzymes:  Recent Labs Lab 08/10/13 1442 08/11/13 0935  TROPONINI <0.30 <0.30   BNP: BNP (last 3 results)  Recent Labs  08/10/13 1439  PROBNP 2399.0*   CBG: No results found for this basename: GLUCAP,  in the last 168 hours     Signed:  Antwoin Lackey S  [facility] 08/11/2013, 2:31 PM

## 2013-08-11 NOTE — Progress Notes (Signed)
Echocardiogram 2D Echocardiogram has been performed.  Estelle GrumblesMyers, Channah Godeaux J 08/11/2013, 1:08 PM

## 2013-08-14 ENCOUNTER — Ambulatory Visit (HOSPITAL_COMMUNITY)
Admission: RE | Admit: 2013-08-14 | Discharge: 2013-08-14 | Disposition: A | Discharge status: Home / Self Care | Payer: Medicare HMO | Source: Ambulatory Visit | Attending: Cardiovascular Disease | Admitting: Cardiovascular Disease

## 2013-08-14 DIAGNOSIS — R9431 Abnormal electrocardiogram [ECG] [EKG]: Secondary | ICD-10-CM

## 2013-08-14 DIAGNOSIS — R002 Palpitations: Secondary | ICD-10-CM

## 2013-08-14 NOTE — Progress Notes (Signed)
48 hr holter monitor Placed.

## 2013-08-16 ENCOUNTER — Other Ambulatory Visit: Payer: Self-pay | Admitting: *Deleted

## 2013-08-16 DIAGNOSIS — R002 Palpitations: Secondary | ICD-10-CM

## 2013-08-19 ENCOUNTER — Other Ambulatory Visit: Payer: Self-pay | Admitting: *Deleted

## 2013-08-19 DIAGNOSIS — R002 Palpitations: Secondary | ICD-10-CM

## 2013-08-21 ENCOUNTER — Telehealth: Payer: Self-pay | Admitting: *Deleted

## 2013-08-21 NOTE — Telephone Encounter (Signed)
Message copied by Tobin ChadMARTIN, Seon Gaertner V. on Wed Aug 21, 2013  1:22 PM ------      Message from: Thurmon FairROITORU, MIHAI      Created: Mon Aug 19, 2013 11:12 AM       Monitor did not show any abnormality that could explain syncope. His echocardiogram in the hospital showed normal heart pumping function. ------

## 2013-08-21 NOTE — Telephone Encounter (Signed)
Spoke to patient. Result given . Verbalized understanding Routine schedule apt 10/07/13

## 2013-09-18 ENCOUNTER — Other Ambulatory Visit (HOSPITAL_COMMUNITY): Payer: Self-pay | Admitting: Cardiovascular Disease

## 2013-09-18 NOTE — Telephone Encounter (Signed)
Rx refill sent to patient pharmacy   

## 2013-09-24 ENCOUNTER — Telehealth (HOSPITAL_COMMUNITY): Payer: Self-pay | Admitting: *Deleted

## 2013-10-07 ENCOUNTER — Encounter: Payer: Self-pay | Admitting: Cardiovascular Disease

## 2013-10-07 ENCOUNTER — Ambulatory Visit (INDEPENDENT_AMBULATORY_CARE_PROVIDER_SITE_OTHER): Payer: Commercial Managed Care - HMO | Admitting: Cardiovascular Disease

## 2013-10-07 VITALS — BP 126/68 | HR 70 | Resp 20 | Ht 67.0 in | Wt 180.5 lb

## 2013-10-07 DIAGNOSIS — I739 Peripheral vascular disease, unspecified: Secondary | ICD-10-CM

## 2013-10-07 DIAGNOSIS — E782 Mixed hyperlipidemia: Secondary | ICD-10-CM

## 2013-10-07 DIAGNOSIS — I10 Essential (primary) hypertension: Secondary | ICD-10-CM

## 2013-10-07 DIAGNOSIS — Z79899 Other long term (current) drug therapy: Secondary | ICD-10-CM

## 2013-10-07 DIAGNOSIS — F17200 Nicotine dependence, unspecified, uncomplicated: Secondary | ICD-10-CM

## 2013-10-07 DIAGNOSIS — E785 Hyperlipidemia, unspecified: Secondary | ICD-10-CM

## 2013-10-07 DIAGNOSIS — R0989 Other specified symptoms and signs involving the circulatory and respiratory systems: Secondary | ICD-10-CM

## 2013-10-07 DIAGNOSIS — Z720 Tobacco use: Secondary | ICD-10-CM

## 2013-10-07 DIAGNOSIS — R55 Syncope and collapse: Secondary | ICD-10-CM

## 2013-10-07 DIAGNOSIS — I2510 Atherosclerotic heart disease of native coronary artery without angina pectoris: Secondary | ICD-10-CM

## 2013-10-07 DIAGNOSIS — R9431 Abnormal electrocardiogram [ECG] [EKG]: Secondary | ICD-10-CM

## 2013-10-07 NOTE — Patient Instructions (Signed)
Your physician has requested that you have a carotid duplex. This test is an ultrasound of the carotid arteries in your neck. It looks at blood flow through these arteries that supply the brain with blood. Allow one hour for this exam. There are no restrictions or special instructions.  Your physician recommends that you return for lab work in:  FASTING  December 2015.  Dr. Royann Shivers recommends that you schedule a follow-up appointment in: One year.

## 2013-10-10 ENCOUNTER — Ambulatory Visit (HOSPITAL_COMMUNITY)
Admission: RE | Admit: 2013-10-10 | Discharge: 2013-10-10 | Disposition: A | Discharge status: Home / Self Care | Payer: Medicare HMO | Source: Ambulatory Visit | Attending: Internal Medicine | Admitting: Internal Medicine

## 2013-10-10 DIAGNOSIS — R0989 Other specified symptoms and signs involving the circulatory and respiratory systems: Secondary | ICD-10-CM | POA: Insufficient documentation

## 2013-10-10 DIAGNOSIS — I6529 Occlusion and stenosis of unspecified carotid artery: Secondary | ICD-10-CM

## 2013-10-10 DIAGNOSIS — I6521 Occlusion and stenosis of right carotid artery: Secondary | ICD-10-CM

## 2013-10-10 NOTE — Progress Notes (Signed)
Carotid Duplex Completed. Rendon Howell, BS, RDMS, RVT  

## 2013-10-11 ENCOUNTER — Other Ambulatory Visit: Payer: Self-pay | Admitting: *Deleted

## 2013-10-11 ENCOUNTER — Encounter: Payer: Self-pay | Admitting: Cardiovascular Disease

## 2013-10-11 DIAGNOSIS — I6529 Occlusion and stenosis of unspecified carotid artery: Secondary | ICD-10-CM

## 2013-10-11 DIAGNOSIS — I739 Peripheral vascular disease, unspecified: Secondary | ICD-10-CM

## 2013-10-11 NOTE — Assessment & Plan Note (Signed)
By history this indeed and the sounds most consistent with a vasovagal event on the background of dehydration on hot summer day

## 2013-10-11 NOTE — Assessment & Plan Note (Signed)
Notes that he has a much lower blood pressure in his left arm in blood pressure should always be checked on the right side. By physical exam, he appears to have a worsening right carotid stenosis. Check duplex ultrasonography. Unclear whether this would've had any impact on his syncopal event.

## 2013-10-11 NOTE — Assessment & Plan Note (Signed)
He continues to struggle to quit smoking.

## 2013-10-11 NOTE — Assessment & Plan Note (Signed)
2009 Free LIMA to LAD (left subclavian artery occlusion), SVG to PDA, SVG to oblique marginal, SVG to ramus intermedius. Previous stents in the LAD artery and left circumflex coronary artery. Excellent response toRanexa with very infrequent episodes of angina pectoris. He has ischemia in the inferolateral wall after losing the saphenous vein graft to the ramus intermedius, but does not have any good options for percutaneous revascularization.

## 2013-10-11 NOTE — Assessment & Plan Note (Signed)
His cholesterol values are acceptable, albeit not perfect. Cannot increase the current dose of simvastatin because of drug interactions. Consider switching to a more potent agent such as Crestor. He can do better with lifestyle changes first.

## 2013-10-11 NOTE — Assessment & Plan Note (Signed)
Excellent control.   

## 2013-10-11 NOTE — Progress Notes (Signed)
Patient ID: [name], male   DOB: 07-19-1945, 68 y.o.   MRN: [id number]      Reason for office visit CAD  [name] was briefly hospitalized at [facility] in July with an episode of syncope. This happened after playing a full game of cold on a summer day and then going to the pool to relax. He felt profound fatigue and lightheadedness before the events and had nausea and vomiting when he woke up. The episode was witnessed and he did not have any injury or seizure like activity. It was felt likely that it was due to dehydration. He had a normal echocardiogram and subsequently wore a 48-hour Holter monitor that was unremarkable.  He has very infrequent angina pectoris, most recently about one week ago. He almost never takes nitroglycerin. He has not had new focal neurological events other than the syncope.  He underwent bypass surgery in 2009 for severe three-vessel coronary disease associated with severe cardiomyopathy (EF 25%). His EF improved back to normal range. He has developed recurrent angina pectoris. Nuclear stress test performed in May of 2013 showed an extensive area of inferolateral ischemia and he underwent cardiac catheterization . There was severe distal disease especially at the level of the left circumflex coronary artery and the distal posterior descending artery and distal LAD. Only 3 bypasses were identified, there was no bypass to the ramus intermedius artery. Because of the diffuse nature of his CAD, medical management was preferred. Adding Ranexa and subsequently adding nitrates at bedtime have helped. Use of conventional antianginal medications, especially beta blockers, has been limited by orthostatic hypotension.   No Known Allergies  Current Outpatient Prescriptions  Medication Sig Dispense Refill  . aspirin EC 81 MG tablet Take 81 mg by mouth daily.      . calcium carbonate (TUMS - DOSED IN MG ELEMENTAL CALCIUM) 500 MG chewable tablet Chew 1 tablet by mouth  3 (three) times daily as needed. For acid stomach      . carvedilol (COREG) 3.125 MG tablet TAKE ONE TABLET BY MOUTH TWICE A DAY  60 tablet  5  . clopidogrel (PLAVIX) 75 MG tablet TAKE ONE (1) TABLET EACH DAY  30 tablet  10  . isosorbide mononitrate (IMDUR) 30 MG 24 hr tablet TAKE ONE TABLET ONE HOUR BEFORE BEDTIME  30 tablet  7  . lisinopril (PRINIVIL,ZESTRIL) 5 MG tablet TAKE ONE TABLET ONCE DAILY  30 tablet  11  . nitroGLYCERIN (NITROSTAT) 0.4 MG SL tablet Place 1 tablet (0.4 mg total) under the tongue every 5 (five) minutes as needed for chest pain.  25 tablet  6  . Omega-3 Fatty Acids (FISH OIL) 1000 MG CAPS Take 1 capsule by mouth daily.      . pantoprazole (PROTONIX) 40 MG tablet Take 1 tablet (40 mg total) by mouth daily.  30 tablet  10  . ranolazine (RANEXA) 1000 MG SR tablet Take 1 tablet (1,000 mg total) by mouth 2 (two) times daily.  84 each  0  . simvastatin (ZOCOR) 20 MG tablet TAKE ONE TABLET EVERY EVENING  30 tablet  3   No current facility-administered medications for this visit.    Past Medical History  Diagnosis Date  . CAD s/p CABG 2009 10/31/2012    Free LIMA to LAD (left subclavian artery occlusion), SVG to PDA, SVG to oblique marginal, SVG to ramus intermedius. Previous stents in the LAD artery and left circumflex coronary artery   . HTN (hypertension) 10/31/2012  . Hyperlipidemia  10/31/2012  . 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 Coronary artery bypass graft  2009    No family history on file.  History   Social History  . Marital Status: Legally Separated    Spouse Name: N/A    Number of Children: N/A  . Years of Education: N/A   Occupational History  . Not on file.   Social History Main Topics  . Smoking status: Current Some Day Smoker -- 0.10 packs/day    Types: Cigarettes  . Smokeless tobacco: Not on file  . Alcohol Use: Yes     Comment: occas.  . Drug Use: No   . Sexual Activity: Not on file   Other Topics Concern  . Not on file   Social History Narrative  . No narrative on file    Review of systems: The patient specifically denies any chest pain at rest, dyspnea at rest or with exertion, orthopnea, paroxysmal nocturnal dyspnea, palpitations, focal neurological deficits, intermittent claudication, lower extremity edema, unexplained weight gain, cough, hemoptysis or wheezing.  The patient also denies abdominal pain, nausea, vomiting, dysphagia, diarrhea, constipation, polyuria, polydipsia, dysuria, hematuria, frequency, urgency, abnormal bleeding or bruising, fever, chills, unexpected weight changes, mood swings, change in skin or hair texture, change in voice quality, auditory or visual problems, allergic reactions or rashes, new musculoskeletal complaints other than usual "aches and pains".  PHYSICAL EXAM BP 126/68  Pulse 70  Resp 20  Ht  (1.702 m)  Wt 81.874 kg (180 lb 8 oz)  BMI 28.26 kg/m2  General: Alert, oriented x3, no distress Head: no evidence of trauma, PERRL, EOMI, no exophtalmos or lid lag, no myxedema, no xanthelasma; normal ears, nose and oropharynx Neck: normal jugular venous pulsations and no hepatojugular reflux; brisk carotid pulses without delay but with bilateral carotid bruits, much louder on the right Chest: clear to auscultation, no signs of consolidation by percussion or palpation, normal fremitus, symmetrical and full respiratory excursions Cardiovascular: normal position and quality of the apical impulse, regular rhythm, normal first and second heart sounds, no murmurs, rubs or gallops Abdomen: no tenderness or distention, no masses by palpation, no abnormal pulsatility or arterial bruits, normal bowel sounds, no hepatosplenomegaly Extremities: no clubbing, cyanosis or edema; 2+ radial, ulnar and brachial pulses bilaterally; 2+ right femoral, posterior tibial and dorsalis pedis pulses; 2+ left femoral, posterior  tibial and dorsalis pedis pulses; no subclavian or femoral bruits Neurological: grossly nonfocal   EKG: Normal sinus rhythm, intraventricular conduction delay (QRS 132 ms), QTC 464 ms, S2 depression most prominent in V4-V6 in the inferior leads, only slightly different from previous tracings  Lipid Panel     Component Value Date/Time   CHOL 186 12/25/2012 0852   TRIG 206* 12/25/2012 0852   HDL 47 12/25/2012 0852   CHOLHDL 4.0 12/25/2012 0852   VLDL 41* 12/25/2012 0852   LDLCALC 98 12/25/2012 0852    BMET    Component Value Date/Time   NA 140 08/11/2013 0622   K 3.8 08/11/2013 0622   CL 106 08/11/2013 0622   CO2 26 08/11/2013 0622   GLUCOSE 99 08/11/2013 0622   BUN 16 08/11/2013 0622   CREATININE 1.14 08/11/2013 0622   CALCIUM 8.1* 08/11/2013 0622   GFRNONAA 66* 08/11/2013 0622   GFRAA 76* 08/11/2013 0622     ASSESSMENT AND PLAN Syncope By history this indeed and the sounds most consistent with a vasovagal  event on the background of dehydration on hot summer day  Prolonged Q-T interval on ECG This is mild and mostly related to QRS prolongation, possibly minor contribution of Ranexa.  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 By physical exam, he appears to have a worsening right carotid stenosis. Check duplex ultrasonography. Unclear whether this would've had any impact on his syncopal event.  HTN (hypertension) Excellent control  CAD s/p CABG 2009 2009 Free LIMA to LAD (left subclavian artery occlusion), SVG to PDA, SVG to oblique marginal, SVG to ramus intermedius. Previous stents in the LAD artery and left circumflex coronary artery. Excellent response toRanexa with very infrequent episodes of angina pectoris. He has ischemia in the inferolateral wall after losing the saphenous vein graft to the ramus intermedius, but does not have any good  options for percutaneous revascularization.  Hyperlipidemia His cholesterol values are acceptable, albeit not perfect. Cannot increase the current dose of simvastatin because of drug interactions. Consider switching to a more potent agent such as Crestor. He can do better with lifestyle changes first.  Tobacco abuse He continues to struggle to quit smoking.   Orders Placed This Encounter  Procedures  . Comprehensive metabolic panel  . Lipid panel  . EKG 12-Lead  . Doppler carotid   Emilia Kayes  Thurmon Fair, MD, [facility] [phone] office [phone] pager

## 2013-10-11 NOTE — Assessment & Plan Note (Signed)
This is mild and mostly related to QRS prolongation, possibly minor contribution of Ranexa.

## 2013-12-29 ENCOUNTER — Other Ambulatory Visit: Payer: Self-pay | Admitting: Cardiovascular Disease

## 2013-12-30 NOTE — Telephone Encounter (Signed)
Rx was sent to pharmacy electronically. 

## 2014-01-02 ENCOUNTER — Encounter (HOSPITAL_COMMUNITY): Payer: Self-pay | Admitting: Cardiovascular Disease

## 2014-01-06 ENCOUNTER — Other Ambulatory Visit: Payer: Self-pay | Admitting: Cardiovascular Disease

## 2014-01-06 NOTE — Telephone Encounter (Signed)
Rx has been sent to the pharmacy electronically. ° °

## 2014-01-27 ENCOUNTER — Other Ambulatory Visit (HOSPITAL_COMMUNITY): Payer: Self-pay | Admitting: Cardiovascular Disease

## 2014-01-27 NOTE — Telephone Encounter (Signed)
Rx(s) sent to pharmacy electronically.  

## 2014-02-09 ENCOUNTER — Other Ambulatory Visit: Payer: Self-pay | Admitting: Cardiovascular Disease

## 2014-04-08 ENCOUNTER — Inpatient Hospital Stay (HOSPITAL_COMMUNITY): Admission: RE | Admit: 2014-04-08 | Payer: Medicare HMO | Source: Ambulatory Visit

## 2014-04-29 ENCOUNTER — Other Ambulatory Visit: Payer: Self-pay | Admitting: Cardiovascular Disease

## 2014-04-29 NOTE — Telephone Encounter (Signed)
Rx has been sent to the pharmacy electronically. ° °

## 2014-07-15 ENCOUNTER — Emergency Department (HOSPITAL_COMMUNITY): Payer: Commercial Managed Care - HMO

## 2014-07-15 ENCOUNTER — Encounter (HOSPITAL_COMMUNITY): Payer: Self-pay

## 2014-07-15 ENCOUNTER — Emergency Department (HOSPITAL_COMMUNITY)
Admission: EM | Admit: 2014-07-15 | Discharge: 2014-07-15 | Disposition: A | Discharge status: Home / Self Care | Payer: Commercial Managed Care - HMO | Attending: Emergency Medicine | Admitting: Emergency Medicine

## 2014-07-15 DIAGNOSIS — Z79899 Other long term (current) drug therapy: Secondary | ICD-10-CM | POA: Insufficient documentation

## 2014-07-15 DIAGNOSIS — Z9889 Other specified postprocedural states: Secondary | ICD-10-CM | POA: Diagnosis not present

## 2014-07-15 DIAGNOSIS — I2510 Atherosclerotic heart disease of native coronary artery without angina pectoris: Secondary | ICD-10-CM | POA: Insufficient documentation

## 2014-07-15 DIAGNOSIS — R55 Syncope and collapse: Secondary | ICD-10-CM | POA: Insufficient documentation

## 2014-07-15 DIAGNOSIS — E785 Hyperlipidemia, unspecified: Secondary | ICD-10-CM | POA: Diagnosis not present

## 2014-07-15 DIAGNOSIS — Z720 Tobacco use: Secondary | ICD-10-CM | POA: Insufficient documentation

## 2014-07-15 DIAGNOSIS — I10 Essential (primary) hypertension: Secondary | ICD-10-CM | POA: Insufficient documentation

## 2014-07-15 DIAGNOSIS — Z951 Presence of aortocoronary bypass graft: Secondary | ICD-10-CM | POA: Diagnosis not present

## 2014-07-15 DIAGNOSIS — Z7982 Long term (current) use of aspirin: Secondary | ICD-10-CM | POA: Diagnosis not present

## 2014-07-15 DIAGNOSIS — R531 Weakness: Secondary | ICD-10-CM | POA: Diagnosis not present

## 2014-07-15 DIAGNOSIS — R42 Dizziness and giddiness: Secondary | ICD-10-CM | POA: Diagnosis not present

## 2014-07-15 DIAGNOSIS — R404 Transient alteration of awareness: Secondary | ICD-10-CM | POA: Diagnosis not present

## 2014-07-15 DIAGNOSIS — Z7902 Long term (current) use of antithrombotics/antiplatelets: Secondary | ICD-10-CM | POA: Insufficient documentation

## 2014-07-15 LAB — CBC WITH DIFFERENTIAL/PLATELET
BASOS ABS: 0 10*3/uL (ref 0.0–0.1)
Basophils Relative: 0 % (ref 0–1)
EOS ABS: 0.1 10*3/uL (ref 0.0–0.7)
EOS PCT: 1 % (ref 0–5)
HCT: 40 % (ref 39.0–52.0)
Hemoglobin: 13.9 g/dL (ref 13.0–17.0)
Lymphocytes Relative: 27 % (ref 12–46)
Lymphs Abs: 1.7 10*3/uL (ref 0.7–4.0)
MCH: 34.9 pg — AB (ref 26.0–34.0)
MCHC: 34.8 g/dL (ref 30.0–36.0)
MCV: 100.5 fL — ABNORMAL HIGH (ref 78.0–100.0)
Monocytes Absolute: 0.4 10*3/uL (ref 0.1–1.0)
Monocytes Relative: 6 % (ref 3–12)
Neutro Abs: 4.2 10*3/uL (ref 1.7–7.7)
Neutrophils Relative %: 66 % (ref 43–77)
Platelets: 166 10*3/uL (ref 150–400)
RBC: 3.98 MIL/uL — ABNORMAL LOW (ref 4.22–5.81)
RDW: 14.7 % (ref 11.5–15.5)
WBC: 6.4 10*3/uL (ref 4.0–10.5)

## 2014-07-15 LAB — COMPREHENSIVE METABOLIC PANEL
ALK PHOS: 48 U/L (ref 38–126)
ALT: 9 U/L — ABNORMAL LOW (ref 17–63)
AST: 14 U/L — ABNORMAL LOW (ref 15–41)
Albumin: 3.4 g/dL — ABNORMAL LOW (ref 3.5–5.0)
Anion gap: 6 (ref 5–15)
BILIRUBIN TOTAL: 0.6 mg/dL (ref 0.3–1.2)
BUN: 14 mg/dL (ref 6–20)
CALCIUM: 8.3 mg/dL — AB (ref 8.9–10.3)
CHLORIDE: 103 mmol/L (ref 101–111)
CO2: 27 mmol/L (ref 22–32)
Creatinine, Ser: 1.26 mg/dL — ABNORMAL HIGH (ref 0.61–1.24)
GFR calc Af Amer: >60 mL/min (ref >60)
GFR calc non Af Amer: 58 mL/min — ABNORMAL LOW (ref >60)
Glucose, Bld: 133 mg/dL — ABNORMAL HIGH (ref 65–99)
Potassium: 4.1 mmol/L (ref 3.5–5.1)
Sodium: 136 mmol/L (ref 135–145)
Total Protein: 6.5 g/dL (ref 6.5–8.1)

## 2014-07-15 LAB — LACTIC ACID, PLASMA: Lactic Acid, Venous: 1.3 mmol/L (ref 0.5–2.0)

## 2014-07-15 MED ORDER — SODIUM CHLORIDE 0.9 % IV BOLUS (SEPSIS): 250.0000 mL | Freq: Once | INTRAVENOUS | Status: DC

## 2014-07-15 NOTE — ED Provider Notes (Signed)
CSN: [id number]     Arrival date & time 07/15/14  1446 History   First MD Initiated Contact with Patient 07/15/14 1522     Chief Complaint  Patient presents with  . Dizziness     (Consider location/radiation/quality/duration/timing/severity/associated sxs/prior Treatment) Patient is a 69 y.o. male presenting with dizziness. The history is provided by the patient (pt states he became sweaty and almost passed out today while outside.  pt feels fine now).  Dizziness Quality:  Lightheadedness Severity:  Moderate Onset quality:  Sudden Timing:  Rare Progression:  Resolved Chronicity:  New Context: not when bending over   Associated symptoms: no chest pain, no diarrhea and no headaches     Past Medical History  Diagnosis Date  . CAD s/p CABG 2009 10/31/2012    Free LIMA to LAD (left subclavian artery occlusion), SVG to PDA, SVG to oblique marginal, SVG to ramus intermedius. Previous stents in the LAD artery and left circumflex coronary artery   . HTN (hypertension) 10/31/2012  . Hyperlipidemia 10/31/2012  . 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 Coronary artery bypass graft  2009  . Left heart catheterization with coronary/graft angiogram N/A 06/08/2011    Procedure: LEFT HEART CATHETERIZATION WITH Isabel Caprice;  Surgeon: Thurmon Fair, MD;  Location: [facility] CATH LAB;  Service: Cardiovascular;  Laterality: N/A;   No family history on file. History  Substance Use Topics  . Smoking status: Current Some Day Smoker -- 0.10 packs/day    Types: Cigarettes  . Smokeless tobacco: Not on file  . Alcohol Use: Yes     Comment: occas.    Review of Systems  Constitutional: Negative for appetite change and fatigue.  HENT: Negative for congestion, ear discharge and sinus pressure.   Eyes: Negative for discharge.  Respiratory: Negative for cough.   Cardiovascular: Negative for chest pain.   Gastrointestinal: Negative for abdominal pain and diarrhea.  Genitourinary: Negative for frequency and hematuria.  Musculoskeletal: Negative for back pain.  Skin: Negative for rash.  Neurological: Positive for dizziness. Negative for seizures and headaches.  Psychiatric/Behavioral: Negative for hallucinations.      Allergies  Review of patient's allergies indicates no known allergies.  Home Medications   Prior to Admission medications   Medication Sig Start Date End Date Taking? Authorizing Provider  aspirin EC 81 MG tablet Take 81 mg by mouth daily.   Yes Historical Provider, MD  calcium carbonate (TUMS - DOSED IN MG ELEMENTAL CALCIUM) 500 MG chewable tablet Chew 1 tablet by mouth 3 (three) times daily as needed. For acid stomach   Yes Historical Provider, MD  carvedilol (COREG) 3.125 MG tablet TAKE ONE TABLET TWICE DAILY 12/30/13  Yes Mihai Croitoru, MD  clopidogrel (PLAVIX) 75 MG tablet TAKE ONE (1) TABLET EACH DAY 01/06/14  Yes Mihai Croitoru, MD  HYDROcodone-acetaminophen (NORCO/VICODIN) 5-325 MG per tablet Take 1-2 tablets by mouth every 6 (six) hours as needed for moderate pain or severe pain.  07/09/14  Yes Historical Provider, MD  lisinopril (PRINIVIL,ZESTRIL) 5 MG tablet TAKE ONE (1) TABLET EACH DAY 02/10/14  Yes Mihai Croitoru, MD  nitroGLYCERIN (NITROSTAT) 0.4 MG SL tablet Place 1 tablet (0.4 mg total) under the tongue every 5 (five) minutes as needed for chest pain. 10/30/12  Yes Mihai Croitoru, MD  pantoprazole (PROTONIX) 40 MG tablet TAKE ONE (1) TABLET BY MOUTH EVERY DAY 01/06/14  Yes Mihai Croitoru, MD  RANEXA 1000 MG SR tablet  TAKE ONE TABLET BY MOUTH TWICE A DAY 04/29/14  Yes Mihai Croitoru, MD  simvastatin (ZOCOR) 20 MG tablet Take 1 tablet (20 mg total) by mouth every evening. 01/27/14  Yes Mihai Croitoru, MD  isosorbide mononitrate (IMDUR) 30 MG 24 hr tablet TAKE ONE TABLET ONE HOUR BEFORE BEDTIME Patient not taking: Reported on 07/15/2014 07/09/13   Mihai Croitoru, MD    BP 111/63 mmHg  Pulse 52  Temp(Src) 98.5 F (36.9 C) (Oral)  Resp 19  Ht  (1.702 m)  Wt 170 lb (77.111 kg)  BMI 26.62 kg/m2  SpO2 96% Physical Exam  Constitutional: He is oriented to person, place, and time. He appears well-developed.  HENT:  Head: Normocephalic.  Eyes: Conjunctivae and EOM are normal. No scleral icterus.  Neck: Neck supple. No thyromegaly present.  Cardiovascular: Normal rate and regular rhythm.  Exam reveals no gallop and no friction rub.   No murmur heard. Pulmonary/Chest: No stridor. He has no wheezes. He has no rales. He exhibits no tenderness.  Abdominal: He exhibits no distension. There is no tenderness. There is no rebound.  Musculoskeletal: Normal range of motion. He exhibits no edema.  Lymphadenopathy:    He has no cervical adenopathy.  Neurological: He is oriented to person, place, and time. He exhibits normal muscle tone. Coordination normal.  Skin: No rash noted. No erythema.  Psychiatric: He has a normal mood and affect. His behavior is normal.    ED Course  Procedures (including critical care time) Labs Review Labs Reviewed  CBC WITH DIFFERENTIAL/PLATELET - Abnormal; Notable for the following:    RBC 3.98 (*)    MCV 100.5 (*)    MCH 34.9 (*)    All other components within normal limits  COMPREHENSIVE METABOLIC PANEL - Abnormal; Notable for the following:    Glucose, Bld 133 (*)    Creatinine, Ser 1.26 (*)    Calcium 8.3 (*)    Albumin 3.4 (*)    AST 14 (*)    ALT 9 (*)    GFR calc non Af Amer 58 (*)    All other components within normal limits  LACTIC ACID, PLASMA    Imaging Review Dg Chest 2 View  07/15/2014   CLINICAL DATA:  Dizziness.  EXAM: CHEST  2 VIEW  COMPARISON:  None.  FINDINGS: Mediastinum hilar structures normal. Prior CABG. Heart size stable. No focal infiltrate. Mild bibasilar subsegmental atelectasis and/or scarring. Degenerative changes thoracic spine.  IMPRESSION: 1. Prior CABG.  Heart size stable.  No CHF. 2.  Mild bibasilar subsegmental atelectasis and/or scarring.   Electronically Signed   By: Maisie Fus  Register   On: 07/15/2014 16:36   Ct Head Wo Contrast  07/15/2014   CLINICAL DATA:  Dizziness.  Initial encounter.  EXAM: CT HEAD WITHOUT CONTRAST  TECHNIQUE: Contiguous axial images were obtained from the base of the skull through the vertex without intravenous contrast.  COMPARISON:  02/28/2008.  FINDINGS: No mass lesion, mass effect, midline shift, hydrocephalus, hemorrhage. No acute territorial cortical ischemia/infarct. Atrophy and chronic ischemic white matter disease is present. Unchanged LEFT posterior internal capsule lacunar infarct. The LEFT basal ganglia lacunar infarct seen on prior exam is less apparent on today's examination, probably due to slice selection.  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  Dense intracranial atherosclerosis. The calvarium is intact. Chronic RIGHT maxillary sinusitis with mucoperiosteal thickening.  IMPRESSION: 1. No acute intracranial abnormality. Atrophy and chronic ischemic white matter disease. 2. Unchanged old LEFT basal ganglia lacunar infarcts and on  the LEFT occipital infarct. 3. Chronic RIGHT maxillary sinusitis.   Electronically Signed   By: Andreas Newport M.D.   On: 07/15/2014 16:23     EKG Interpretation None      MDM   Final diagnoses:  None    Near syncope,  resolved    Bethann Berkshire, MD 07/15/14 [phone]

## 2014-07-15 NOTE — Discharge Instructions (Signed)
Follow up with your md this week for recheck  °

## 2014-07-15 NOTE — ED Notes (Signed)
Pt states he had been sitting out on a covered patio for about 45 min. States he became weak and dizzy. Denies pain or SOB

## 2014-07-15 NOTE — ED Notes (Signed)
Patient with no complaints at this time. Respirations even and unlabored. Skin warm/dry. Discharge instructions reviewed with patient at this time. Patient given opportunity to voice concerns/ask questions. IV removed per policy and band-aid applied to site. Patient discharged at this time and left Emergency Department with steady gait.  

## 2014-09-24 ENCOUNTER — Other Ambulatory Visit: Payer: Self-pay | Admitting: Cardiovascular Disease

## 2014-09-24 DIAGNOSIS — I6523 Occlusion and stenosis of bilateral carotid arteries: Secondary | ICD-10-CM

## 2014-10-02 ENCOUNTER — Telehealth: Payer: Self-pay

## 2014-10-02 ENCOUNTER — Ambulatory Visit (HOSPITAL_COMMUNITY)
Admission: RE | Admit: 2014-10-02 | Discharge: 2014-10-02 | Disposition: A | Discharge status: Home / Self Care | Payer: Commercial Managed Care - HMO | Source: Ambulatory Visit | Attending: Cardiovascular Disease | Admitting: Cardiovascular Disease

## 2014-10-02 ENCOUNTER — Telehealth: Payer: Self-pay | Admitting: *Deleted

## 2014-10-02 DIAGNOSIS — F17200 Nicotine dependence, unspecified, uncomplicated: Secondary | ICD-10-CM | POA: Diagnosis not present

## 2014-10-02 DIAGNOSIS — E785 Hyperlipidemia, unspecified: Secondary | ICD-10-CM | POA: Diagnosis not present

## 2014-10-02 DIAGNOSIS — I6523 Occlusion and stenosis of bilateral carotid arteries: Secondary | ICD-10-CM | POA: Diagnosis not present

## 2014-10-02 DIAGNOSIS — I10 Essential (primary) hypertension: Secondary | ICD-10-CM | POA: Insufficient documentation

## 2014-10-02 DIAGNOSIS — I2510 Atherosclerotic heart disease of native coronary artery without angina pectoris: Secondary | ICD-10-CM | POA: Insufficient documentation

## 2014-10-02 NOTE — Telephone Encounter (Signed)
Patient her for Carotid Ultrasound  Results given to Dr. Tresa Endo DOD for review.  Appointment will be made by scheduling with follow up appointment with Dr. Allyson Sabal based on results of carotid ultrasound as ordered by Dr. Tresa Endo.    Patient also has c/o that Protonix is not holding him for the 24 hours and has to take tums.  Patient wanted Dr. Royann Shivers know to see if there is something else he can take

## 2014-10-02 NOTE — Telephone Encounter (Signed)
I spoke with patient and made an appt to see Dr Allyson Sabal.

## 2014-10-03 NOTE — Telephone Encounter (Signed)
Try taking pepcid 20 mg or zantac 150 mg at bedtime

## 2014-10-03 NOTE — Telephone Encounter (Signed)
LVM identified to add Pepcid 20 mg or zantac 150 mg at night and that this is in addition to protonix  Instructed him to call back if necessary

## 2014-10-03 NOTE — Telephone Encounter (Signed)
That is in addition to the protonix

## 2014-10-13 DIAGNOSIS — I739 Peripheral vascular disease, unspecified: Secondary | ICD-10-CM | POA: Diagnosis not present

## 2014-10-13 DIAGNOSIS — Z6827 Body mass index (BMI) 27.0-27.9, adult: Secondary | ICD-10-CM | POA: Diagnosis not present

## 2014-10-13 DIAGNOSIS — Z23 Encounter for immunization: Secondary | ICD-10-CM | POA: Diagnosis not present

## 2014-10-13 DIAGNOSIS — Z1389 Encounter for screening for other disorder: Secondary | ICD-10-CM | POA: Diagnosis not present

## 2014-10-13 DIAGNOSIS — E663 Overweight: Secondary | ICD-10-CM | POA: Diagnosis not present

## 2014-10-13 DIAGNOSIS — I2510 Atherosclerotic heart disease of native coronary artery without angina pectoris: Secondary | ICD-10-CM | POA: Diagnosis not present

## 2014-10-15 ENCOUNTER — Telehealth: Payer: Self-pay

## 2014-10-15 ENCOUNTER — Telehealth: Payer: Self-pay | Admitting: Cardiovascular Disease

## 2014-10-15 ENCOUNTER — Ambulatory Visit (INDEPENDENT_AMBULATORY_CARE_PROVIDER_SITE_OTHER): Payer: Commercial Managed Care - HMO | Admitting: Cardiovascular Disease

## 2014-10-15 ENCOUNTER — Encounter: Payer: Self-pay | Admitting: Cardiovascular Disease

## 2014-10-15 VITALS — BP 115/64 | HR 86 | Ht 67.0 in | Wt 169.3 lb

## 2014-10-15 DIAGNOSIS — I2510 Atherosclerotic heart disease of native coronary artery without angina pectoris: Secondary | ICD-10-CM

## 2014-10-15 DIAGNOSIS — R0989 Other specified symptoms and signs involving the circulatory and respiratory systems: Secondary | ICD-10-CM

## 2014-10-15 DIAGNOSIS — I6523 Occlusion and stenosis of bilateral carotid arteries: Secondary | ICD-10-CM | POA: Diagnosis not present

## 2014-10-15 DIAGNOSIS — I779 Disorder of arteries and arterioles, unspecified: Secondary | ICD-10-CM | POA: Insufficient documentation

## 2014-10-15 DIAGNOSIS — I739 Peripheral vascular disease, unspecified: Secondary | ICD-10-CM

## 2014-10-15 NOTE — Progress Notes (Signed)
10/15/2014 [name]   1945-03-26  [id number]  Primary Physician [name]., MD Primary Cardiologist: Runell Gess MD [name]   HPI:  [name] is a 69 year old Caucasian male patient of Dr. Erin [name] referred to me for asymptomatic high-grade right internal carotid artery stenosis. He has a history of CAD status post bypass grafting in 2009 for three-vessel disease and ejection fraction of 25% at that time. This is simply improved by 2-D echo. A nuclear stress test performed May 2013 showed an extensive area of inferolateral ischemia. As a result of this general and cardiac catheterization revealing an occluded vein to the ramus branch as well as distal disease in the LAD and circumflex coronary arteries not amenable to intervention and medical therapy was recommended. After adding Ranexa and titrating his nitrates his angina has improved. His other problems include continued tobacco abuse, hypertension and hyperlipidemia.   Current Outpatient Prescriptions  Medication Sig Dispense Refill  . aspirin EC 81 MG tablet Take 81 mg by mouth daily.    . carvedilol (COREG) 3.125 MG tablet TAKE ONE TABLET TWICE DAILY 60 tablet 9  . clopidogrel (PLAVIX) 75 MG tablet TAKE ONE (1) TABLET EACH DAY 30 tablet 9  . isosorbide mononitrate (IMDUR) 30 MG 24 hr tablet TAKE ONE TABLET ONE HOUR BEFORE BEDTIME 30 tablet 7  . lisinopril (PRINIVIL,ZESTRIL) 5 MG tablet TAKE ONE (1) TABLET EACH DAY 30 tablet 8  . nitroGLYCERIN (NITROSTAT) 0.4 MG SL tablet Place 1 tablet (0.4 mg total) under the tongue every 5 (five) minutes as needed for chest pain. 25 tablet 6  . pantoprazole (PROTONIX) 40 MG tablet TAKE ONE (1) TABLET BY MOUTH EVERY DAY 30 tablet 9  . RANEXA 1000 MG SR tablet TAKE ONE TABLET BY MOUTH TWICE A DAY 60 each 5  . simvastatin (ZOCOR) 20 MG tablet Take 1 tablet (20 mg total) by mouth every evening. 30 tablet 8   No current facility-administered medications for this  visit.    No Known Allergies  Social History   Social History  . Marital Status: Legally Separated    Spouse Name: N/A  . Number of Children: N/A  . Years of Education: N/A   Occupational History  . Not on file.   Social History Main Topics  . Smoking status: Current Some Day Smoker -- 0.10 packs/day    Types: Cigarettes  . Smokeless tobacco: Not on file  . Alcohol Use: Yes     Comment: occas.  . Drug Use: No  . Sexual Activity: Not on file   Other Topics Concern  . Not on file   Social History Narrative     Review of Systems: General: negative for chills, fever, night sweats or weight changes.  Cardiovascular: negative for chest pain, dyspnea on exertion, edema, orthopnea, palpitations, paroxysmal nocturnal dyspnea or shortness of breath Dermatological: negative for rash Respiratory: negative for cough or wheezing Urologic: negative for hematuria Abdominal: negative for nausea, vomiting, diarrhea, bright red blood per rectum, melena, or hematemesis Neurologic: negative for visual changes, syncope, or dizziness All other systems reviewed and are otherwise negative except as noted above.    Blood pressure 115/64, pulse 86, height  (1.702 m), weight 169 lb 4.8 oz (76.794 kg), SpO2 82 %.  General appearance: alert and no distress Neck: no adenopathy, no JVD, supple, symmetrical, trachea midline, thyroid not enlarged, symmetric, no tenderness/mass/nodules and bilateral carotid bruits right lateral left Lungs: clear to auscultation bilaterally Heart: regular rate and  rhythm, S1, S2 normal, no murmur, click, rub or gallop Extremities: extremities normal, atraumatic, no cyanosis or edema Pulses: 2+ and symmetric Skin: Skin color, texture, turgor normal. No rashes or lesions Neurologic: Grossly normal  EKG not performed today  ASSESSMENT AND PLAN:   Carotid artery disease [name] was referred to me by Dr. Royann [name] for evaluation of asymptomatic high-grade  right internal carotid artery stenosis. The Doppler was performed 10/02/14. 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 His Dopplers revealed systolic velocity of 529 cm/s on the right with a diastolic velocity of 245 suggesting a blockage in the 90-95% range. He is on dual antiplatelet Therapy and is neurologically asymptomatic. His last 2-D echo revealed normal LV function. He has had coronary artery bypass grafting with subsequent catheter showed an occluded ramus branch graft as well as distal disease. After adding Ranexa and titrating his nitrates his angina has markedly improved. I'm going to refer him to Dr. Myra [name] to evaluate him for suitability of endarterectomy versus carotid artery stenting.      Runell Gess MD FACP,FACC,FAHA, [facility] 10/15/2014 10:47 AM

## 2014-10-15 NOTE — Telephone Encounter (Signed)
SEE EARLIER NOTE

## 2014-10-15 NOTE — Telephone Encounter (Signed)
Pt made aware of request to have ECHO and Myoview.  Pt agrees to have testing BEFORE appointment with Dr. Earle Gell. Sent request to scheduling to setup testing appointments

## 2014-10-15 NOTE — Assessment & Plan Note (Signed)
[name] was referred to me by Dr. Royann Shivers for evaluation of asymptomatic high-grade right internal carotid artery stenosis. The Doppler was performed 10/02/14. 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 His Dopplers revealed systolic velocity of 529 cm/s on the right with a diastolic velocity of 245 suggesting a blockage in the 90-95% range. He is on dual antiplatelet Therapy and is neurologically asymptomatic. His last 2-D echo revealed normal LV function. He has had coronary artery bypass grafting with subsequent catheter showed an occluded ramus branch graft as well as distal disease. After adding Ranexa and titrating his nitrates his angina has markedly improved. I'm going to refer him to Dr. Myra Gianotti to evaluate him for suitability of endarterectomy versus carotid artery stenting.

## 2014-10-15 NOTE — Telephone Encounter (Signed)
Called pt to tell him that Dr. Allyson Sabal spoke with Dr. Myra Gianotti, with [facility].  They would like him to have a 2D-ECHO and a LexiMyoview before his appointment with Dr. Earle Gell.  Pt needs to be educated on the tests and scheduled if he agrees. Orders already in system for test. Left message for pt to call back.

## 2014-10-15 NOTE — Patient Instructions (Addendum)
Medication Instructions:  Your physician recommends that you continue on your current medications as directed. Please refer to the Current Medication list given to you today.   Labwork: NONE  Testing/Procedures: PLEASE SCHEDULE BEFORE APPOINTMENT WITH DR. Myra Gianotti:   Your physician has requested that you have an echocardiogram. Echocardiography is a painless test that uses sound waves to create images of your heart. It provides your doctor with information about the size and shape of your heart and how well your heart's chambers and valves are working. This procedure takes approximately one hour. There are no restrictions for this procedure.  Your physician has requested that you have a lexiscan myoview. For further information please visit [email or web address] Please follow instruction sheet, as given.   Follow-Up: Your physician recommends that you schedule a follow-up appointment in: 3 months with Dr. San Morelle have been referred to Dr. Arelia Longest with [facility]    Any Other Special Instructions Will Be Listed Below (If Applicable).

## 2014-10-16 ENCOUNTER — Telehealth: Payer: Self-pay | Admitting: Cardiovascular Disease

## 2014-10-16 NOTE — Telephone Encounter (Signed)
Called [facility] to see if patient had been set up with Dr. Myra Gianotti.  Dr. Myra Gianotti will be opening up a day in October, but, has not decided what day.  They will call the patient when it is scheduled.

## 2014-10-25 ENCOUNTER — Other Ambulatory Visit: Payer: Self-pay | Admitting: Cardiovascular Disease

## 2014-10-27 NOTE — Telephone Encounter (Signed)
Rx(s) sent to pharmacy electronically.  

## 2014-10-28 ENCOUNTER — Telehealth (HOSPITAL_COMMUNITY): Payer: Self-pay | Admitting: *Deleted

## 2014-10-28 NOTE — Telephone Encounter (Signed)
Left message on voicemail in reference to upcoming appointment scheduled for 10/30/14. Phone number given for a call back so details instructions can be given. [name] W   

## 2014-10-29 ENCOUNTER — Telehealth (HOSPITAL_COMMUNITY): Payer: Self-pay | Admitting: Radiology

## 2014-10-29 NOTE — Telephone Encounter (Signed)
Patient given detailed instructions per Myocardial Perfusion Study Information Sheet for test on 10/30/2014 at 7:15. Patient notified to arrive 15 minutes early and that it is imperative to arrive on time for appointment to keep from having the test rescheduled.  If you need to cancel or reschedule your appointment, please call the office within 24 hours of your appointment. Failure to do so may result in a cancellation of your appointment, and a $50 no show fee. Patient verbalized understanding. EY.

## 2014-10-30 ENCOUNTER — Ambulatory Visit (HOSPITAL_BASED_OUTPATIENT_CLINIC_OR_DEPARTMENT_OTHER): Payer: Commercial Managed Care - HMO

## 2014-10-30 ENCOUNTER — Other Ambulatory Visit: Payer: Self-pay

## 2014-10-30 ENCOUNTER — Ambulatory Visit (HOSPITAL_COMMUNITY): Payer: Commercial Managed Care - HMO | Attending: Cardiovascular Disease

## 2014-10-30 ENCOUNTER — Ambulatory Visit (HOSPITAL_COMMUNITY): Payer: Commercial Managed Care - HMO

## 2014-10-30 DIAGNOSIS — E785 Hyperlipidemia, unspecified: Secondary | ICD-10-CM | POA: Insufficient documentation

## 2014-10-30 DIAGNOSIS — F17200 Nicotine dependence, unspecified, uncomplicated: Secondary | ICD-10-CM | POA: Insufficient documentation

## 2014-10-30 DIAGNOSIS — I351 Nonrheumatic aortic (valve) insufficiency: Secondary | ICD-10-CM | POA: Diagnosis not present

## 2014-10-30 DIAGNOSIS — Z951 Presence of aortocoronary bypass graft: Secondary | ICD-10-CM | POA: Insufficient documentation

## 2014-10-30 DIAGNOSIS — R0989 Other specified symptoms and signs involving the circulatory and respiratory systems: Secondary | ICD-10-CM

## 2014-10-30 DIAGNOSIS — I2510 Atherosclerotic heart disease of native coronary artery without angina pectoris: Secondary | ICD-10-CM | POA: Diagnosis not present

## 2014-10-30 DIAGNOSIS — I10 Essential (primary) hypertension: Secondary | ICD-10-CM | POA: Diagnosis not present

## 2014-10-30 DIAGNOSIS — I6523 Occlusion and stenosis of bilateral carotid arteries: Secondary | ICD-10-CM | POA: Diagnosis not present

## 2014-10-30 DIAGNOSIS — I517 Cardiomegaly: Secondary | ICD-10-CM | POA: Diagnosis not present

## 2014-10-30 DIAGNOSIS — R9439 Abnormal result of other cardiovascular function study: Secondary | ICD-10-CM | POA: Insufficient documentation

## 2014-10-30 DIAGNOSIS — I340 Nonrheumatic mitral (valve) insufficiency: Secondary | ICD-10-CM | POA: Insufficient documentation

## 2014-10-30 LAB — MYOCARDIAL PERFUSION IMAGING
CHL CUP NUCLEAR SDS: 3
CHL CUP NUCLEAR SSS: 12
LHR: 0.33
LV dias vol: 175 mL
LVSYSVOL: 116 mL
Peak HR: 75 {beats}/min
Rest HR: 55 {beats}/min
SRS: 9
TID: 1.13

## 2014-10-30 MED ORDER — TECHNETIUM TC 99M SESTAMIBI GENERIC - CARDIOLITE
11.0000 | Freq: Once | INTRAVENOUS | Status: AC | PRN
  Administered 2014-10-30: 11 via INTRAVENOUS

## 2014-10-30 MED ORDER — TECHNETIUM TC 99M SESTAMIBI GENERIC - CARDIOLITE
31.9000 | Freq: Once | INTRAVENOUS | Status: AC | PRN
  Administered 2014-10-30: 31.9 via INTRAVENOUS

## 2014-10-30 MED ORDER — REGADENOSON 0.4 MG/5ML IV SOLN
0.4000 mg | Freq: Once | INTRAVENOUS | Status: AC
  Administered 2014-10-30: 0.4 mg via INTRAVENOUS

## 2014-10-31 ENCOUNTER — Telehealth: Payer: Self-pay | Admitting: Cardiovascular Disease

## 2014-10-31 NOTE — Telephone Encounter (Signed)
Returning your call from yesterday. °

## 2014-11-03 NOTE — Telephone Encounter (Signed)
Left message for patient to call back  

## 2014-11-03 NOTE — Telephone Encounter (Signed)
Pt made aware of echo and myoview results.  Pt scheduled for f/u appt on 10/18@ 10am

## 2014-11-06 ENCOUNTER — Encounter: Payer: Self-pay | Admitting: Surgery

## 2014-11-07 ENCOUNTER — Telehealth: Payer: Self-pay | Admitting: Cardiovascular Disease

## 2014-11-07 NOTE — Telephone Encounter (Signed)
Pt decided not to leave a message 

## 2014-11-10 ENCOUNTER — Encounter: Payer: Self-pay | Admitting: Cardiovascular Disease

## 2014-11-10 ENCOUNTER — Ambulatory Visit (INDEPENDENT_AMBULATORY_CARE_PROVIDER_SITE_OTHER): Payer: Commercial Managed Care - HMO | Admitting: Surgery

## 2014-11-10 ENCOUNTER — Encounter: Payer: Self-pay | Admitting: Surgery

## 2014-11-10 VITALS — BP 137/82 | HR 57 | Temp 97.6°F | Resp 16 | Ht 67.0 in | Wt 169.0 lb

## 2014-11-10 DIAGNOSIS — I6521 Occlusion and stenosis of right carotid artery: Secondary | ICD-10-CM

## 2014-11-10 NOTE — Progress Notes (Signed)
Patient name: [name] MRN: [id number] DOB: 03-Jul-1945 Sex: male   Referred by: Dr. Allyson SabalBerry  Reason for referral:  Chief Complaint  Patient presents with  . Carotid    Referred by Dr. Allyson SabalBerry for right carotid stenosis    HISTORY OF PRESENT ILLNESS: This is a very pleasant 69 year old gentleman who is referred today for evaluation of an asymptomatic right carotid stenosis.  This was detected initially on Doppler ultrasound and has been followed.  Recently progressed to greater than 80%.  He denies numbness or weakness in either extremity.  He denies slurred speech.  He denies amaurosis fugax.  He does endorse several episodes of dizziness and near fainting.  The most recent of which was 2 weeks ago.  The patient has a history of coronary artery disease.  He is status post CABG in 2009.  He had a free LIMA to the LAD given his left subclavian occlusion.  He suffers from hypercholesterolemia which is managed with a statin.  His blood pressure is managed with an ACE inhibitor.  He continues to smoke but is trying to quit.  He is on dual antiplatelet therapy.  He recently underwent a nuclear stress test which showed an ejection fraction of 34% there was no significant reversible findings.  Past Medical History  Diagnosis Date  . CAD s/p CABG 2009 10/31/2012    Free LIMA to LAD (left subclavian artery occlusion), SVG to PDA, SVG to oblique marginal, SVG to ramus intermedius. Previous stents in the LAD artery and left circumflex coronary artery   . HTN (hypertension) 10/31/2012  . Hyperlipidemia 10/31/2012  . 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 Bilateral carotid artery disease [facility])     Past Surgical History  Procedure Laterality Date  . Coronary artery bypass graft  2009  . Left heart catheterization with coronary/graft angiogram N/A 06/08/2011    Procedure: LEFT HEART CATHETERIZATION WITH Isabel CapriceORONARY/GRAFT ANGIOGRAM;  Surgeon: Thurmon FairMihai Croitoru, MD;   Location: [facility] CATH LAB;  Service: Cardiovascular;  Laterality: N/A;    Social History   Social History  . Marital Status: Legally Separated    Spouse Name: N/A  . Number of Children: N/A  . Years of Education: N/A   Occupational History  . Not on file.   Social History Main Topics  . Smoking status: Current Some Day Smoker -- 0.10 packs/day    Types: Cigarettes  . Smokeless tobacco: Not on file  . Alcohol Use: Yes     Comment: occas.  . Drug Use: No  . Sexual Activity: Not on file   Other Topics Concern  . Not on file   Social History Narrative    Family History  Problem Relation Age of Onset  . Congestive Heart Failure Mother   . Stroke Father   . Heart attack Maternal Grandmother   . Heart attack Maternal Grandfather     Allergies as of 11/10/2014  . (No Known Allergies)    Current Outpatient Prescriptions on File Prior to Visit  Medication Sig Dispense Refill  . aspirin EC 81 MG tablet Take 81 mg by mouth daily.    . carvedilol (COREG) 3.125 MG tablet TAKE ONE TABLET TWICE DAILY 60 tablet 9  . clopidogrel (PLAVIX) 75 MG tablet Take 1 tablet (75 mg total) by mouth daily. 30 tablet 11  . isosorbide mononitrate (IMDUR) 30 MG 24 hr tablet TAKE ONE TABLET ONE HOUR BEFORE BEDTIME 30 tablet 7  . lisinopril (  PRINIVIL,ZESTRIL) 5 MG tablet TAKE ONE (1) TABLET EACH DAY 30 tablet 8  . nitroGLYCERIN (NITROSTAT) 0.4 MG SL tablet Place 1 tablet (0.4 mg total) under the tongue every 5 (five) minutes as needed for chest pain. 25 tablet 6  . pantoprazole (PROTONIX) 40 MG tablet TAKE ONE (1) TABLET BY MOUTH EVERY DAY 30 tablet 9  . RANEXA 1000 MG SR tablet TAKE ONE TABLET BY MOUTH TWICE A DAY 60 each 5  . simvastatin (ZOCOR) 20 MG tablet Take 1 tablet (20 mg total) by mouth every evening. 30 tablet 8   No current facility-administered medications on file prior to visit.     REVIEW OF SYSTEMS: Please see history of present illness, otherwise negative  PHYSICAL  EXAMINATION:  Filed Vitals:   11/10/14 0839  BP: 137/82  Pulse: 57  Temp: 97.6 F (36.4 C)  TempSrc: Oral  Resp: 16  Height:  (1.702 m)  Weight: 169 lb (76.658 kg)  SpO2: 98%   Body mass index is 26.46 kg/(m^2). General: The patient appears their stated age.   HEENT:  No gross abnormalities Pulmonary: Respirations are non-labored Abdomen: Soft and non-tender  Musculoskeletal: There are no major deformities.   Neurologic: No focal weakness or paresthesias are detected, Skin: There are no ulcer or rashes noted. Psychiatric: The patient has normal affect. Cardiovascular: There is a regular rate and rhythm without significant murmur appreciated.  Diagnostic Studies: Nuclear stress echo:  Nuclear stress EF: 34%.  The left ventricular ejection fraction is moderately decreased (30-44%).  There was no ST segment deviation noted during stress. Resting ST segment abnormalities did not change with stress.  Defect 1: There is a medium defect of moderate severity present in the basal inferolateral and mid inferolateral location.  This is a high risk study.  There is a scar in the inferolateral myocardium. There is no significant reversibility. SDS=3. There is significant LV systolic dysfunction with EF 34% and inferior wall hypokinesis.  Carotid Doppler:  Greater than 80% right carotid stenosis.  1 no history percent left carotid stenosis  Bifurcation is mid hyoid   Assessment:  Asymptomatic right carotid stenosis Plan: I discussed our treatment options which include stenting versus carotid endarterectomy.  We have decided to proceed with right carotid endarterectomy.  The risks and benefits of the operation were discussed with the patient including the risk of stroke, nerve injury, and facial numbness as well as cardio pulmonary complications.  All his questions were answered.  He would like to proceed after November 11.  I would like for him to be off his Plavix for 5  days.  He is been scheduled for Thursday, November 17.     Jorge Ny, M.D. [facility] Office: [phone] Pager:  [phone]

## 2014-11-11 ENCOUNTER — Encounter: Payer: Self-pay | Admitting: Cardiovascular Disease

## 2014-11-11 ENCOUNTER — Ambulatory Visit (INDEPENDENT_AMBULATORY_CARE_PROVIDER_SITE_OTHER): Payer: Commercial Managed Care - HMO | Admitting: Cardiovascular Disease

## 2014-11-11 VITALS — BP 142/74 | HR 55 | Ht 67.0 in | Wt 167.8 lb

## 2014-11-11 DIAGNOSIS — I779 Disorder of arteries and arterioles, unspecified: Secondary | ICD-10-CM

## 2014-11-11 DIAGNOSIS — Z23 Encounter for immunization: Secondary | ICD-10-CM | POA: Diagnosis not present

## 2014-11-11 DIAGNOSIS — I6523 Occlusion and stenosis of bilateral carotid arteries: Secondary | ICD-10-CM

## 2014-11-11 DIAGNOSIS — I739 Peripheral vascular disease, unspecified: Secondary | ICD-10-CM

## 2014-11-11 NOTE — Patient Instructions (Signed)
Medication Instructions:  Your physician recommends that you continue on your current medications as directed. Please refer to the Current Medication list given to you today.  Labwork: none  Testing/Procedures: Your physician has requested that you have a carotid duplex. This test is an ultrasound of the carotid arteries in your neck. It looks at blood flow through these arteries that supply the brain with blood. Allow one hour for this exam. There are no restrictions or special instructions. IN 3 MONTHS   Follow-Up: Your physician wants you to follow-up in: 6 MONTHS WITH DR. Allyson SabalBERRY. You will receive a reminder letter in the mail two months in advance. If you don't receive a letter, please call our office to schedule the follow-up appointment.   Any Other Special Instructions Will Be Listed Below (If Applicable).

## 2014-11-11 NOTE — Progress Notes (Signed)
Mr. Doreene ElandHendrix  has high-grade right ICA stenosis. He saw Dr. Myra GianottiBrabham who felt he was at an acceptable endarterectomy patient. His Myoview is low risk with inferolateral scar and his EF by 2-D echo was 45-50%. I have reiterated the importance of stopping his Plavix 7 days prior to his surgical procedure scheduled for 12/11/14. We will get follow-up  Carotid Dopplers on him in 3 months and I will see him back in 6 months for follow-up. I am clearing him at low cardiovascular risk for his upcoming surgical procedure

## 2014-11-11 NOTE — Assessment & Plan Note (Signed)
Mr. Labrie  has high-grade right ICA stenosis. He saw Dr. Brabham who felt he was at an acceptable endarterectomy patient. His Myoview is low risk with inferolateral scar and his EF by 2-D echo was 45-50%. I have reiterated the importance of stopping his Plavix 7 days prior to his surgical procedure scheduled for 12/11/14. We will get follow-up  Carotid Dopplers on him in 3 months and I will see him back in 6 months for follow-up. I am clearing him at low cardiovascular risk for his upcoming surgical procedure 

## 2014-11-13 ENCOUNTER — Other Ambulatory Visit: Payer: Self-pay | Admitting: Cardiovascular Disease

## 2014-11-13 DIAGNOSIS — Z1389 Encounter for screening for other disorder: Secondary | ICD-10-CM | POA: Diagnosis not present

## 2014-11-13 DIAGNOSIS — G250 Essential tremor: Secondary | ICD-10-CM | POA: Diagnosis not present

## 2014-11-13 DIAGNOSIS — Z6826 Body mass index (BMI) 26.0-26.9, adult: Secondary | ICD-10-CM | POA: Diagnosis not present

## 2014-11-13 DIAGNOSIS — E663 Overweight: Secondary | ICD-10-CM | POA: Diagnosis not present

## 2014-11-13 MED ORDER — NITROGLYCERIN 0.4 MG SL SUBL: 0.4000 mg | SUBLINGUAL_TABLET | SUBLINGUAL | Status: AC | PRN

## 2014-11-14 ENCOUNTER — Inpatient Hospital Stay (HOSPITAL_COMMUNITY): Admission: RE | Admit: 2014-11-14 | Payer: Commercial Managed Care - HMO | Source: Ambulatory Visit

## 2014-11-14 ENCOUNTER — Other Ambulatory Visit: Payer: Self-pay | Admitting: *Deleted

## 2014-11-27 ENCOUNTER — Other Ambulatory Visit: Payer: Self-pay | Admitting: Cardiovascular Disease

## 2014-12-02 NOTE — Pre-Procedure Instructions (Signed)
    Genene ChurnJames M Hurlbut  12/02/2014      [facility] - [place], [place] - [address] [place] [address] Phone: [phone] Fax: [phone]    Your procedure is scheduled on : Thursday, November 17.   [facility] Ad[facility] at 5:30 A.M.                  Your surgery is schedule for 8:30 A.M.   Call this number if you have problems the morning of surgery: [phone]                For any other questions, please call [phone], Monday - Friday 8 AM - 4 PM.   Remember:  Do not eat food or drink liquids after midnight Wednesday, November 16.  Take these medicines the morning of surgery with A SIP OF WATER :aspirin, carvedilol (COREG), isosorbide mononitrate (IMDUR), pantoprazole (PROTONIX), RANEXA .                  Stop Plavix as instructed by Dr Myra GianottiBrabham.               Take Nitroglycerin if needed.   Do not wear jewelry, make-up or nail polish.   Do not wear lotions, powders, or perfumes.                Men may shave face and neck.   Do not bring valuables to the hospital.   [facility] is not responsible for any belongings or valuables.  Contacts, dentures or bridgework may not be worn into surgery.  Leave your suitcase in the car.  After surgery it may be brought to your room.  For patients admitted to the hospital, discharge time will be determined by your treatment team.  Special instructions: Review  [facility] - Preparing For Surgery.  Please read over the following fact sheets that you were given. Pain Booklet, Coughing and Deep Breathing, Blood Transfusion Information and Surgical Site Infection Prevention

## 2014-12-03 ENCOUNTER — Encounter (HOSPITAL_COMMUNITY): Payer: Self-pay

## 2014-12-03 ENCOUNTER — Encounter (HOSPITAL_COMMUNITY)
Admission: RE | Admit: 2014-12-03 | Discharge: 2014-12-03 | Disposition: A | Discharge status: Home / Self Care | Payer: Commercial Managed Care - HMO | Source: Ambulatory Visit | Attending: Surgery | Admitting: Surgery

## 2014-12-03 DIAGNOSIS — Z01818 Encounter for other preprocedural examination: Secondary | ICD-10-CM | POA: Insufficient documentation

## 2014-12-03 DIAGNOSIS — I6521 Occlusion and stenosis of right carotid artery: Secondary | ICD-10-CM | POA: Insufficient documentation

## 2014-12-03 HISTORY — DX: Adverse effect of unspecified anesthetic, initial encounter: T41.45XA

## 2014-12-03 HISTORY — DX: Other complications of anesthesia, initial encounter: T88.59XA

## 2014-12-03 HISTORY — DX: Urgency of urination: R39.15

## 2014-12-03 HISTORY — DX: Personal history of other mental and behavioral disorders: Z86.59

## 2014-12-03 HISTORY — DX: Cerebral infarction, unspecified: I63.9

## 2014-12-03 LAB — TYPE AND SCREEN
ABO/RH(D): O POS
ANTIBODY SCREEN: NEGATIVE

## 2014-12-03 LAB — CBC
HCT: 46 % (ref 39.0–52.0)
Hemoglobin: 15.9 g/dL (ref 13.0–17.0)
MCH: 36.1 pg — ABNORMAL HIGH (ref 26.0–34.0)
MCHC: 34.6 g/dL (ref 30.0–36.0)
MCV: 104.3 fL — ABNORMAL HIGH (ref 78.0–100.0)
PLATELETS: 189 10*3/uL (ref 150–400)
RBC: 4.41 MIL/uL (ref 4.22–5.81)
RDW: 13.8 % (ref 11.5–15.5)
WBC: 7.7 10*3/uL (ref 4.0–10.5)

## 2014-12-03 LAB — COMPREHENSIVE METABOLIC PANEL
ALBUMIN: 3.6 g/dL (ref 3.5–5.0)
ALT: 13 U/L — ABNORMAL LOW (ref 17–63)
AST: 18 U/L (ref 15–41)
Alkaline Phosphatase: 61 U/L (ref 38–126)
Anion gap: 8 (ref 5–15)
BUN: 11 mg/dL (ref 6–20)
CHLORIDE: 100 mmol/L — AB (ref 101–111)
CO2: 26 mmol/L (ref 22–32)
Calcium: 9.4 mg/dL (ref 8.9–10.3)
Creatinine, Ser: 1.31 mg/dL — ABNORMAL HIGH (ref 0.61–1.24)
GFR calc Af Amer: >60 mL/min (ref >60)
GFR calc non Af Amer: 55 mL/min — ABNORMAL LOW (ref >60)
GLUCOSE: 94 mg/dL (ref 65–99)
POTASSIUM: 4.3 mmol/L (ref 3.5–5.1)
SODIUM: 134 mmol/L — AB (ref 135–145)
Total Bilirubin: 0.6 mg/dL (ref 0.3–1.2)
Total Protein: 7.1 g/dL (ref 6.5–8.1)

## 2014-12-03 LAB — URINALYSIS, ROUTINE W REFLEX MICROSCOPIC
BILIRUBIN URINE: NEGATIVE
Glucose, UA: NEGATIVE mg/dL
HGB URINE DIPSTICK: NEGATIVE
Ketones, ur: NEGATIVE mg/dL
Leukocytes, UA: NEGATIVE
Nitrite: NEGATIVE
Protein, ur: NEGATIVE mg/dL
SPECIFIC GRAVITY, URINE: 1.013 (ref 1.005–1.030)
UROBILINOGEN UA: 1 mg/dL (ref 0.0–1.0)
pH: 7 (ref 5.0–8.0)

## 2014-12-03 LAB — PROTIME-INR
INR: 1.04 (ref 0.00–1.49)
PROTHROMBIN TIME: 13.8 s (ref 11.6–15.2)

## 2014-12-03 LAB — APTT: APTT: 30 s (ref 24–37)

## 2014-12-03 LAB — SURGICAL PCR SCREEN
MRSA, PCR: NEGATIVE
Staphylococcus aureus: NEGATIVE

## 2014-12-03 NOTE — Progress Notes (Signed)
PC[facility] - [facility] - Dr. Elfredia NevinsLawrence Fusco Cardiologist - Dr. Royann Shiversroitoru/ Dr. Allyson SabalBerry   EKG - 07/15/14 CXR - 07/15/14  Echo- 10/30/14 Stress test - 10/30/14 - high risk study Cardiac cath - 2013  Patient denies chest pain and shortness of breath at PAT appointment.    Patient states that he will take his last dose of Plavix on Friday, November 11th.

## 2014-12-04 NOTE — Progress Notes (Signed)
Anesthesia Chart Review:  Pt is 69 year old male scheduled for R CEA on 12/11/2014 with Dr. Myra GianottiBrabham.   Cardiologist is Dr. Nanetta BattyJonathan [name].   PMH includes:  CAD (S/p CABG 2009: LIMA-LAD, SVG-PDA, SVG-oblique marginal, SVG-ramus int. LAD and CX with prior stenting), HTN, PAD (chronic total occlusion of L subclavian artery), B carotid artery disease, stroke, hyperlipidemia. Current smoker. BMI 26.5  Anesthesia history: pt reports difficulty breathing when waking up from anesthesia  Medications include: ASA, carvedilol, plavix, imdur, lisinopril, protonix, ranexa, simvastatin. Pt will stop plavix 12/05/14.  Chest x-ray 07/15/14 reviewed.  1. Prior CABG. Heart size stable. No CHF. 2. Mild bibasilar subsegmental atelectasis and/or scarring.  EKG 07/15/14: Sinus bradycardia (50 bpm). Non-specific intra-ventricular conduction block. T wave abnormality, consider inferior ischemia. T wave abnormality, consider anterolateral ischemia.  Nuclear stress test 10/30/14:   Nuclear stress EF: 34%.  The left ventricular ejection fraction is moderately decreased (30-44%).  There was no ST segment deviation noted during stress. Resting ST segment abnormalities did not change with stress.  Defect 1: There is a medium defect of moderate severity present in the basal inferolateral and mid inferolateral location.  This is a high risk study.  There is a scar in the inferolateral myocardium. There is no significant reversibility. SDS=3. There is significant LV systolic dysfunction with EF 34% and inferior wall hypokinesis.  Echo 10/30/14:  - Left ventricle: The cavity size was normal. There was moderate concentric hypertrophy. Systolic function was mildly reduced. The estimated ejection fraction was in the range of 45% to 50%. Hypokinesis of the inferolateral myocardium. Dyskinesis of the basalinferior myocardium. Doppler parameters are consistent with abnormal left ventricular relaxation (grade 1 diastolic  dysfunction). - Aortic valve: Trileaflet; normal thickness, mildly calcified leaflets. Mobility was not restricted. Transvalvular velocity was within the normal range. There was no stenosis. There was mild regurgitation. - Mitral valve: There was mild regurgitation. - Left atrium: The atrium was moderately dilated. - Right ventricle: The cavity size was moderately dilated. Wall thickness was normal. Systolic function was reduced. - Global longitudinal strain -13%  Carotid duplex US 10/02/14: Heterogeneous plaque, bilaterally. >80% RICA stenosis. 1-39% LICA stenosis. Abnormal monophasic flow noted in the left subclavian artery, consistent with known proximal occlusion.  Pt has cardiac clearance from Dr. Allyson SabalBerry at low risk for surgery.   If no changes, I anticipate pt can proceed with surgery as scheduled.   Rica Mastngela Angelli Baruch, FNP-BC [facility]Anesthesiology Phone: [phone] 12/04/2014 1:46 PM

## 2014-12-09 ENCOUNTER — Other Ambulatory Visit: Payer: Self-pay | Admitting: Cardiovascular Disease

## 2014-12-10 MED ORDER — CHLORHEXIDINE GLUCONATE CLOTH 2 % EX PADS: 6.0000 | MEDICATED_PAD | Freq: Once | CUTANEOUS | Status: DC

## 2014-12-10 MED ORDER — SODIUM CHLORIDE 0.9 % IV SOLN
INTRAVENOUS | Status: DC
  Administered 2014-12-11: 09:00:00 via INTRAVENOUS

## 2014-12-10 MED ORDER — DEXTROSE 5 % IV SOLN
1.5000 g | INTRAVENOUS | Status: AC
  Administered 2014-12-11: 1.5 g via INTRAVENOUS
  Filled 2014-12-10: qty 1.5

## 2014-12-11 ENCOUNTER — Inpatient Hospital Stay (HOSPITAL_COMMUNITY): Payer: Commercial Managed Care - HMO | Admitting: Emergency Medicine

## 2014-12-11 ENCOUNTER — Encounter (HOSPITAL_COMMUNITY): Payer: Self-pay | Admitting: General Practice

## 2014-12-11 ENCOUNTER — Inpatient Hospital Stay (HOSPITAL_COMMUNITY)
Admission: RE | Admit: 2014-12-11 | Discharge: 2014-12-12 | DRG: 039 | Disposition: A | Discharge status: Home / Self Care | Payer: Commercial Managed Care - HMO | Source: Ambulatory Visit | Attending: Surgery | Admitting: Surgery

## 2014-12-11 ENCOUNTER — Inpatient Hospital Stay (HOSPITAL_COMMUNITY): Payer: Commercial Managed Care - HMO | Admitting: Anesthesiology

## 2014-12-11 ENCOUNTER — Encounter (HOSPITAL_COMMUNITY)
Admission: RE | Disposition: A | Discharge status: Home / Self Care | Payer: Self-pay | Source: Ambulatory Visit | Attending: Surgery

## 2014-12-11 DIAGNOSIS — Z955 Presence of coronary angioplasty implant and graft: Secondary | ICD-10-CM

## 2014-12-11 DIAGNOSIS — Z7902 Long term (current) use of antithrombotics/antiplatelets: Secondary | ICD-10-CM

## 2014-12-11 DIAGNOSIS — F17210 Nicotine dependence, cigarettes, uncomplicated: Secondary | ICD-10-CM | POA: Diagnosis present

## 2014-12-11 DIAGNOSIS — I10 Essential (primary) hypertension: Secondary | ICD-10-CM | POA: Diagnosis not present

## 2014-12-11 DIAGNOSIS — Z23 Encounter for immunization: Secondary | ICD-10-CM | POA: Diagnosis not present

## 2014-12-11 DIAGNOSIS — I2510 Atherosclerotic heart disease of native coronary artery without angina pectoris: Secondary | ICD-10-CM | POA: Diagnosis present

## 2014-12-11 DIAGNOSIS — I6521 Occlusion and stenosis of right carotid artery: Principal | ICD-10-CM | POA: Diagnosis present

## 2014-12-11 DIAGNOSIS — Z7982 Long term (current) use of aspirin: Secondary | ICD-10-CM

## 2014-12-11 DIAGNOSIS — E785 Hyperlipidemia, unspecified: Secondary | ICD-10-CM | POA: Diagnosis not present

## 2014-12-11 DIAGNOSIS — I739 Peripheral vascular disease, unspecified: Secondary | ICD-10-CM | POA: Diagnosis not present

## 2014-12-11 DIAGNOSIS — Z951 Presence of aortocoronary bypass graft: Secondary | ICD-10-CM | POA: Diagnosis not present

## 2014-12-11 HISTORY — PX: ENDARTERECTOMY: SHX5162

## 2014-12-11 LAB — CREATININE, SERUM
CREATININE: 1.01 mg/dL (ref 0.61–1.24)
GFR calc non Af Amer: >60 mL/min (ref >60)

## 2014-12-11 LAB — CBC
HCT: 34.5 % — ABNORMAL LOW (ref 39.0–52.0)
Hemoglobin: 12.1 g/dL — ABNORMAL LOW (ref 13.0–17.0)
MCH: 36 pg — ABNORMAL HIGH (ref 26.0–34.0)
MCHC: 35.1 g/dL (ref 30.0–36.0)
MCV: 102.7 fL — ABNORMAL HIGH (ref 78.0–100.0)
Platelets: 167 10*3/uL (ref 150–400)
RBC: 3.36 MIL/uL — ABNORMAL LOW (ref 4.22–5.81)
RDW: 13.5 % (ref 11.5–15.5)
WBC: 8.1 10*3/uL (ref 4.0–10.5)

## 2014-12-11 SURGERY — ENDARTERECTOMY, CAROTID
Anesthesia: General | Site: Neck | Laterality: Right

## 2014-12-11 MED ORDER — ONDANSETRON HCL 4 MG/2ML IJ SOLN
INTRAMUSCULAR | Status: DC | PRN
  Administered 2014-12-11: 4 mg via INTRAVENOUS

## 2014-12-11 MED ORDER — LIDOCAINE HCL 4 % MT SOLN
OROMUCOSAL | Status: DC | PRN
  Administered 2014-12-11: 4 mL via TOPICAL

## 2014-12-11 MED ORDER — OXYCODONE-ACETAMINOPHEN 5-325 MG PO TABS
1.0000 | ORAL_TABLET | ORAL | Status: DC | PRN
  Administered 2014-12-11 (×2): 2 via ORAL
  Filled 2014-12-11 (×2): qty 2

## 2014-12-11 MED ORDER — HEPARIN SODIUM (PORCINE) 1000 UNIT/ML IJ SOLN
INTRAMUSCULAR | Status: AC
  Filled 2014-12-11: qty 1

## 2014-12-11 MED ORDER — PROPOFOL 10 MG/ML IV BOLUS
INTRAVENOUS | Status: AC
  Filled 2014-12-11: qty 20

## 2014-12-11 MED ORDER — LIDOCAINE HCL (CARDIAC) 20 MG/ML IV SOLN
INTRAVENOUS | Status: AC
  Filled 2014-12-11: qty 10

## 2014-12-11 MED ORDER — HEPARIN SODIUM (PORCINE) 1000 UNIT/ML IJ SOLN
INTRAMUSCULAR | Status: DC | PRN
  Administered 2014-12-11: 1000 [IU] via INTRAVENOUS
  Administered 2014-12-11: 8000 [IU] via INTRAVENOUS

## 2014-12-11 MED ORDER — MIDAZOLAM HCL 2 MG/2ML IJ SOLN
INTRAMUSCULAR | Status: AC
  Filled 2014-12-11: qty 2

## 2014-12-11 MED ORDER — PROTAMINE SULFATE 10 MG/ML IV SOLN
INTRAVENOUS | Status: AC
Start: 2014-12-11 — End: 2014-12-11
  Filled 2014-12-11: qty 5

## 2014-12-11 MED ORDER — GLYCOPYRROLATE 0.2 MG/ML IJ SOLN
INTRAMUSCULAR | Status: DC | PRN
  Administered 2014-12-11: .4 mg via INTRAVENOUS
  Administered 2014-12-11 (×2): 0.2 mg via INTRAVENOUS

## 2014-12-11 MED ORDER — SODIUM CHLORIDE 0.9 % IV SOLN
500.0000 mL | Freq: Once | INTRAVENOUS | Status: AC | PRN
  Administered 2014-12-11 (×2): 500 mL via INTRAVENOUS

## 2014-12-11 MED ORDER — POTASSIUM CHLORIDE CRYS ER 20 MEQ PO TBCR
20.0000 meq | EXTENDED_RELEASE_TABLET | Freq: Every day | ORAL | Status: DC | PRN
Start: 2014-12-11 — End: 2014-12-12

## 2014-12-11 MED ORDER — GLYCOPYRROLATE 0.2 MG/ML IJ SOLN
INTRAMUSCULAR | Status: AC
  Filled 2014-12-11: qty 2

## 2014-12-11 MED ORDER — DOCUSATE SODIUM 100 MG PO CAPS
100.0000 mg | ORAL_CAPSULE | Freq: Every day | ORAL | Status: DC
  Administered 2014-12-12: 100 mg via ORAL
  Filled 2014-12-11: qty 1

## 2014-12-11 MED ORDER — LACTATED RINGERS IV SOLN
INTRAVENOUS | Status: DC | PRN
  Administered 2014-12-11: 07:00:00 via INTRAVENOUS

## 2014-12-11 MED ORDER — METOPROLOL TARTRATE 1 MG/ML IV SOLN: 2.0000 mg | INTRAVENOUS | Status: DC | PRN

## 2014-12-11 MED ORDER — GUAIFENESIN-DM 100-10 MG/5ML PO SYRP: 15.0000 mL | ORAL_SOLUTION | ORAL | Status: DC | PRN

## 2014-12-11 MED ORDER — SIMVASTATIN 20 MG PO TABS
20.0000 mg | ORAL_TABLET | Freq: Every evening | ORAL | Status: DC
  Filled 2014-12-11: qty 1

## 2014-12-11 MED ORDER — ASPIRIN EC 81 MG PO TBEC
81.0000 mg | DELAYED_RELEASE_TABLET | Freq: Every day | ORAL | Status: DC
  Administered 2014-12-12: 81 mg via ORAL
  Filled 2014-12-11 (×2): qty 1

## 2014-12-11 MED ORDER — MORPHINE SULFATE (PF) 2 MG/ML IV SOLN
2.0000 mg | INTRAVENOUS | Status: DC | PRN
  Administered 2014-12-11 (×2): 2 mg via INTRAVENOUS
  Filled 2014-12-11 (×2): qty 1

## 2014-12-11 MED ORDER — ALUM & MAG HYDROXIDE-SIMETH 200-200-20 MG/5ML PO SUSP: 15.0000 mL | ORAL | Status: DC | PRN

## 2014-12-11 MED ORDER — LABETALOL HCL 5 MG/ML IV SOLN: 10.0000 mg | INTRAVENOUS | Status: DC | PRN

## 2014-12-11 MED ORDER — ROCURONIUM BROMIDE 100 MG/10ML IV SOLN
INTRAVENOUS | Status: DC | PRN
  Administered 2014-12-11: 50 mg via INTRAVENOUS
  Administered 2014-12-11: 20 mg via INTRAVENOUS

## 2014-12-11 MED ORDER — PROMETHAZINE HCL 25 MG/ML IJ SOLN: 6.2500 mg | INTRAMUSCULAR | Status: DC | PRN

## 2014-12-11 MED ORDER — ALBUTEROL SULFATE HFA 108 (90 BASE) MCG/ACT IN AERS
INHALATION_SPRAY | RESPIRATORY_TRACT | Status: DC | PRN
  Administered 2014-12-11: 2 via RESPIRATORY_TRACT

## 2014-12-11 MED ORDER — ACETAMINOPHEN 325 MG PO TABS: 325.0000 mg | ORAL_TABLET | ORAL | Status: DC | PRN

## 2014-12-11 MED ORDER — HEMOSTATIC AGENTS (NO CHARGE) OPTIME
TOPICAL | Status: DC | PRN
Start: 2014-12-11 — End: 2014-12-11
  Administered 2014-12-11: 1 via TOPICAL

## 2014-12-11 MED ORDER — LISINOPRIL 5 MG PO TABS
5.0000 mg | ORAL_TABLET | Freq: Every day | ORAL | Status: DC
  Administered 2014-12-12: 5 mg via ORAL
  Filled 2014-12-11 (×2): qty 1

## 2014-12-11 MED ORDER — REMIFENTANIL BOLUS VIA INFUSION OPTIME
INTRAVENOUS | Status: DC | PRN
  Administered 2014-12-11 (×2): 11.6 ug via INTRAVENOUS

## 2014-12-11 MED ORDER — HYDRALAZINE HCL 20 MG/ML IJ SOLN: 5.0000 mg | INTRAMUSCULAR | Status: DC | PRN

## 2014-12-11 MED ORDER — PROPOFOL 10 MG/ML IV BOLUS
INTRAVENOUS | Status: DC | PRN
  Administered 2014-12-11: 150 mg via INTRAVENOUS

## 2014-12-11 MED ORDER — PHENOL 1.4 % MT LIQD: 1.0000 | OROMUCOSAL | Status: DC | PRN

## 2014-12-11 MED ORDER — ACETAMINOPHEN 650 MG RE SUPP: 325.0000 mg | RECTAL | Status: DC | PRN

## 2014-12-11 MED ORDER — DEXTROSE 5 % IV SOLN
1.5000 g | Freq: Two times a day (BID) | INTRAVENOUS | Status: AC
  Administered 2014-12-11 – 2014-12-12 (×2): 1.5 g via INTRAVENOUS
  Filled 2014-12-11 (×2): qty 1.5

## 2014-12-11 MED ORDER — MIDAZOLAM HCL 5 MG/5ML IJ SOLN
INTRAMUSCULAR | Status: DC | PRN
  Administered 2014-12-11: 2 mg via INTRAVENOUS

## 2014-12-11 MED ORDER — SODIUM CHLORIDE 0.9 % IV SOLN: INTRAVENOUS | Status: DC

## 2014-12-11 MED ORDER — ENOXAPARIN SODIUM 40 MG/0.4ML ~~LOC~~ SOLN
40.0000 mg | SUBCUTANEOUS | Status: DC
  Administered 2014-12-12: 40 mg via SUBCUTANEOUS
  Filled 2014-12-11 (×2): qty 0.4

## 2014-12-11 MED ORDER — CARVEDILOL 3.125 MG PO TABS
3.1250 mg | ORAL_TABLET | Freq: Two times a day (BID) | ORAL | Status: DC
  Administered 2014-12-12: 3.125 mg via ORAL
  Filled 2014-12-11 (×4): qty 1

## 2014-12-11 MED ORDER — RANOLAZINE ER 500 MG PO TB12
1000.0000 mg | ORAL_TABLET | Freq: Two times a day (BID) | ORAL | Status: DC
  Administered 2014-12-11 – 2014-12-12 (×2): 1000 mg via ORAL
  Filled 2014-12-11 (×3): qty 2

## 2014-12-11 MED ORDER — 0.9 % SODIUM CHLORIDE (POUR BTL) OPTIME
TOPICAL | Status: DC | PRN
  Administered 2014-12-11: 3000 mL

## 2014-12-11 MED ORDER — PANTOPRAZOLE SODIUM 40 MG PO TBEC
40.0000 mg | DELAYED_RELEASE_TABLET | Freq: Every day | ORAL | Status: DC
  Administered 2014-12-12: 40 mg via ORAL
  Filled 2014-12-11: qty 1

## 2014-12-11 MED ORDER — HYDROMORPHONE HCL 1 MG/ML IJ SOLN
INTRAMUSCULAR | Status: AC
Start: 2014-12-11 — End: 2014-12-12
  Filled 2014-12-11: qty 1

## 2014-12-11 MED ORDER — FENTANYL CITRATE (PF) 100 MCG/2ML IJ SOLN
INTRAMUSCULAR | Status: DC | PRN
  Administered 2014-12-11: 50 ug via INTRAVENOUS

## 2014-12-11 MED ORDER — PNEUMOCOCCAL VAC POLYVALENT 25 MCG/0.5ML IJ INJ
0.5000 mL | INJECTION | INTRAMUSCULAR | Status: AC
  Administered 2014-12-12: 0.5 mL via INTRAMUSCULAR
  Filled 2014-12-11: qty 0.5

## 2014-12-11 MED ORDER — SODIUM CHLORIDE 0.9 % IV SOLN
INTRAVENOUS | Status: DC | PRN
  Administered 2014-12-11: 500 mL

## 2014-12-11 MED ORDER — ISOSORBIDE MONONITRATE ER 30 MG PO TB24
30.0000 mg | ORAL_TABLET | Freq: Every day | ORAL | Status: DC
  Administered 2014-12-12: 30 mg via ORAL
  Filled 2014-12-11 (×2): qty 1

## 2014-12-11 MED ORDER — MAGNESIUM SULFATE 2 GM/50ML IV SOLN: 2.0000 g | Freq: Every day | INTRAVENOUS | Status: DC | PRN

## 2014-12-11 MED ORDER — ONDANSETRON HCL 4 MG/2ML IJ SOLN: 4.0000 mg | Freq: Four times a day (QID) | INTRAMUSCULAR | Status: DC | PRN

## 2014-12-11 MED ORDER — HYDROMORPHONE HCL 1 MG/ML IJ SOLN
0.2500 mg | INTRAMUSCULAR | Status: DC | PRN
  Administered 2014-12-11 (×2): 0.25 mg via INTRAVENOUS

## 2014-12-11 MED ORDER — ROCURONIUM BROMIDE 50 MG/5ML IV SOLN
INTRAVENOUS | Status: AC
  Filled 2014-12-11: qty 2

## 2014-12-11 MED ORDER — LIDOCAINE HCL (PF) 1 % IJ SOLN
INTRAMUSCULAR | Status: AC
  Filled 2014-12-11: qty 30

## 2014-12-11 MED ORDER — INFLUENZA VAC SPLIT QUAD 0.5 ML IM SUSY
0.5000 mL | PREFILLED_SYRINGE | INTRAMUSCULAR | Status: AC
  Administered 2014-12-12: 0.5 mL via INTRAMUSCULAR
  Filled 2014-12-11: qty 0.5

## 2014-12-11 MED ORDER — LIDOCAINE HCL (CARDIAC) 20 MG/ML IV SOLN
INTRAVENOUS | Status: DC | PRN
  Administered 2014-12-11: 60 mg via INTRAVENOUS

## 2014-12-11 MED ORDER — MEPERIDINE HCL 25 MG/ML IJ SOLN: 6.2500 mg | INTRAMUSCULAR | Status: DC | PRN

## 2014-12-11 MED ORDER — PHENYLEPHRINE HCL 10 MG/ML IJ SOLN
10.0000 mg | INTRAVENOUS | Status: DC | PRN
  Administered 2014-12-11: 25 ug/min via INTRAVENOUS

## 2014-12-11 MED ORDER — SODIUM CHLORIDE 0.9 % IV SOLN
1000.0000 ug | INTRAVENOUS | Status: DC | PRN
  Administered 2014-12-11: .2 ug/kg/min via INTRAVENOUS

## 2014-12-11 MED ORDER — NITROGLYCERIN 0.4 MG SL SUBL: 0.4000 mg | SUBLINGUAL_TABLET | SUBLINGUAL | Status: DC | PRN

## 2014-12-11 MED ORDER — NEOSTIGMINE METHYLSULFATE 10 MG/10ML IV SOLN
INTRAVENOUS | Status: DC | PRN
  Administered 2014-12-11: 3 mg via INTRAVENOUS

## 2014-12-11 MED ORDER — FENTANYL CITRATE (PF) 250 MCG/5ML IJ SOLN
INTRAMUSCULAR | Status: AC
  Filled 2014-12-11: qty 5

## 2014-12-11 MED ORDER — PROTAMINE SULFATE 10 MG/ML IV SOLN
INTRAVENOUS | Status: DC | PRN
  Administered 2014-12-11: 40 mg via INTRAVENOUS
  Administered 2014-12-11: 10 mg via INTRAVENOUS

## 2014-12-11 SURGICAL SUPPLY — 54 items
CANISTER SUCTION 2500CC (MISCELLANEOUS) ×3 IMPLANT
CATH ROBINSON RED A/P 18FR (CATHETERS) ×3 IMPLANT
CATH SUCT 10FR WHISTLE TIP (CATHETERS) ×5 IMPLANT
CLIP TI MEDIUM 6 (CLIP) ×3 IMPLANT
CLIP TI WIDE RED SMALL 24 (CLIP) ×2 IMPLANT
CLIP TI WIDE RED SMALL 6 (CLIP) ×3 IMPLANT
CRADLE DONUT ADULT HEAD (MISCELLANEOUS) ×3 IMPLANT
DRAIN CHANNEL 15F RND FF W/TCR (WOUND CARE) IMPLANT
DRAPE PROXIMA HALF (DRAPES) ×2 IMPLANT
ELECT REM PT RETURN 9FT ADLT (ELECTROSURGICAL) ×3
ELECTRODE REM PT RTRN 9FT ADLT (ELECTROSURGICAL) ×1 IMPLANT
EVACUATOR SILICONE 100CC (DRAIN) IMPLANT
GAUZE SPONGE 4X4 12PLY STRL (GAUZE/BANDAGES/DRESSINGS) ×1 IMPLANT
GLOVE BIO SURGEON STRL SZ 6.5 (GLOVE) ×2 IMPLANT
GLOVE BIO SURGEONS STRL SZ 6.5 (GLOVE) ×2
GLOVE BIOGEL PI IND STRL 6.5 (GLOVE) ×3 IMPLANT
GLOVE BIOGEL PI IND STRL 7.0 (GLOVE) IMPLANT
GLOVE BIOGEL PI IND STRL 7.5 (GLOVE) ×1 IMPLANT
GLOVE BIOGEL PI INDICATOR 6.5 (GLOVE) ×6
GLOVE BIOGEL PI INDICATOR 7.0 (GLOVE) ×2
GLOVE BIOGEL PI INDICATOR 7.5 (GLOVE) ×2
GLOVE ECLIPSE 7.5 STRL STRAW (GLOVE) ×4 IMPLANT
GLOVE SURG SS PI 6.5 STRL IVOR (GLOVE) ×4 IMPLANT
GLOVE SURG SS PI 7.5 STRL IVOR (GLOVE) ×3 IMPLANT
GOWN STRL REUS W/ TWL LRG LVL3 (GOWN DISPOSABLE) ×2 IMPLANT
GOWN STRL REUS W/ TWL XL LVL3 (GOWN DISPOSABLE) ×1 IMPLANT
GOWN STRL REUS W/TWL LRG LVL3 (GOWN DISPOSABLE) ×6
GOWN STRL REUS W/TWL XL LVL3 (GOWN DISPOSABLE) ×6
HEMOSTAT SNOW SURGICEL 2X4 (HEMOSTASIS) ×2 IMPLANT
INSERT FOGARTY SM (MISCELLANEOUS) IMPLANT
KIT BASIN OR (CUSTOM PROCEDURE TRAY) ×3 IMPLANT
KIT ROOM TURNOVER OR (KITS) ×3 IMPLANT
LIQUID BAND (GAUZE/BANDAGES/DRESSINGS) ×3 IMPLANT
NDL HYPO 25GX1X1/2 BEV (NEEDLE) IMPLANT
NEEDLE HYPO 25GX1X1/2 BEV (NEEDLE) IMPLANT
NS IRRIG 1000ML POUR BTL (IV SOLUTION) ×9 IMPLANT
PACK CAROTID (CUSTOM PROCEDURE TRAY) ×3 IMPLANT
PAD ARMBOARD 7.5X6 YLW CONV (MISCELLANEOUS) ×6 IMPLANT
PATCH VASC XENOSURE 1CMX6CM (Vascular Products) ×3 IMPLANT
PATCH VASC XENOSURE 1X6 (Vascular Products) IMPLANT
SHUNT CAROTID BYPASS 10 (VASCULAR PRODUCTS) IMPLANT
SHUNT CAROTID BYPASS 12FRX15.5 (VASCULAR PRODUCTS) IMPLANT
SPONGE INTESTINAL PEANUT (DISPOSABLE) ×1 IMPLANT
SUT ETHILON 3 0 PS 1 (SUTURE) IMPLANT
SUT PROLENE 5 0 C 1 24 (SUTURE) ×2 IMPLANT
SUT PROLENE 6 0 BV (SUTURE) ×5 IMPLANT
SUT PROLENE 7 0 BV 1 (SUTURE) IMPLANT
SUT SILK 3 0 TIES 17X18 (SUTURE)
SUT SILK 3-0 18XBRD TIE BLK (SUTURE) IMPLANT
SUT VIC AB 3-0 SH 27 (SUTURE) ×6
SUT VIC AB 3-0 SH 27X BRD (SUTURE) ×2 IMPLANT
SUT VICRYL 4-0 PS2 18IN ABS (SUTURE) ×5 IMPLANT
SYR CONTROL 10ML LL (SYRINGE) IMPLANT
WATER STERILE IRR 1000ML POUR (IV SOLUTION) ×3 IMPLANT

## 2014-12-11 NOTE — Anesthesia Preprocedure Evaluation (Addendum)
Anesthesia Evaluation  Patient identified by MRN, date of birth, ID band Patient awake    Reviewed: Allergy & Precautions, NPO status , Patient's Chart, lab work & pertinent test results  Airway Mallampati: II  TM Distance: >3 FB Neck ROM: Full    Dental no notable dental hx.    Pulmonary Current Smoker,    Pulmonary exam normal breath sounds clear to auscultation       Cardiovascular hypertension, Pt. on medications + CAD, + CABG and + Peripheral Vascular Disease  Normal cardiovascular exam Rhythm:Regular Rate:Normal     Neuro/Psych CVA, No Residual Symptoms negative psych ROS   GI/Hepatic negative GI ROS, Neg liver ROS,   Endo/Other  negative endocrine ROS  Renal/GU negative Renal ROS     Musculoskeletal negative musculoskeletal ROS (+)   Abdominal   Peds  Hematology negative hematology ROS (+)   Anesthesia Other Findings   Reproductive/Obstetrics                            Anesthesia Physical Anesthesia Plan  ASA: III  Anesthesia Plan: General   Post-op Pain Management:    Induction: Intravenous  Airway Management Planned: Oral ETT  Additional Equipment: Arterial line  Intra-op Plan:   Post-operative Plan: Extubation in OR  Informed Consent: I have reviewed the patients History and Physical, chart, labs and discussed the procedure including the risks, benefits and alternatives for the proposed anesthesia with the patient or authorized representative who has indicated his/her understanding and acceptance.   Dental advisory given  Plan Discussed with: CRNA  Anesthesia Plan Comments: (Remi gtt)        Anesthesia Quick Evaluation

## 2014-12-11 NOTE — Anesthesia Procedure Notes (Signed)
Procedure Name: Intubation Date/Time: 12/11/2014 7:45 AM Performed by: Mariea Clonts Pre-anesthesia Checklist: Patient identified, Emergency Drugs available, Patient being monitored, Timeout performed and Suction available Patient Re-evaluated:Patient Re-evaluated prior to inductionOxygen Delivery Method: Circle system utilized Preoxygenation: Pre-oxygenation with 100% oxygen Intubation Type: IV induction Ventilation: Mask ventilation without difficulty and Oral airway inserted - appropriate to patient size Laryngoscope Size: Sabra Heck and 2 Grade View: Grade I Tube type: Oral Tube size: 7.5 mm Number of attempts: 1 Airway Equipment and Method: LTA kit utilized Placement Confirmation: ETT inserted through vocal cords under direct vision,  positive ETCO2 and breath sounds checked- equal and bilateral Tube secured with: Tape Dental Injury: Teeth and Oropharynx as per pre-operative assessment

## 2014-12-11 NOTE — Transfer of Care (Signed)
Immediate Anesthesia Transfer of Care Note  Patient: [name]  Procedure(s) Performed: Procedure(s): RIGHT CAROTID ENDARTERECTOMY WITH BOVINE PERICARDIAL PATCH ANGIOPLASTY (Right)  Patient Location: PACU  Anesthesia Type:General  Level of Consciousness: awake, alert  and oriented  Airway & Oxygen Therapy: Patient Spontanous Breathing and Patient connected to nasal cannula oxygen  Post-op Assessment: Report given to RN, Post -op Vital signs reviewed and stable, Patient moving all extremities X 4 and Patient able to stick tongue midline  Post vital signs: Reviewed and stable  Last Vitals:  Filed Vitals:   12/11/14 0547  BP: 144/76  Pulse: 55  Temp: 36.2 C  Resp: 16    Complications: No apparent anesthesia complications

## 2014-12-11 NOTE — Op Note (Signed)
Patient name: [name] MRN: [id number] DOB: 30-Sep-1945 Sex: male  12/11/2014 Pre-operative Diagnosis: Asymptomatic   right carotid stenosis Post-operative diagnosis:  Same Surgeon:  Durene Cal Assistants:  Karsten Ro Procedure:    right carotid Endarterectomy with bovine pericardial  patch angioplasty Anesthesia:  General Blood Loss:  See anesthesia record Specimens:  Carotid Plaque to pathology  Findings:  90 %stenosis; Thrombus:  none  Indications:  The patient has been followed with serial ultrasound from his carotid stenosis.  He remains asymptomatic.  Velocities indicate greater than 80% stenosis.  He comes today for revascularization  Procedure:  The patient was identified in the holding area and taken to [facility] OR ROOM 16  The patient was then placed supine on the table.   General endotrachial anesthesia was administered.  The patient was prepped and draped in the usual sterile fashion.  A time out was called and antibiotics were administered.  The incision was made along the anterior border of the right sternocleidomastoid muscle.  Cautery was used to dissect through the subcutaneous tissue.  The platysma muscle was divided with cautery.  The internal jugular vein was exposed along its anterior medial border.  The common facial vein was exposed and then divided between 2-0 silk ties and metal clips.  The common carotid artery was then circumferentially exposed and encircled with an umbilical tape.  The vagus nerve was identified and protected.  Next sharp dissection was used to expose the external carotid artery and the superior thyroid artery.  The were encircled with a blue vessel loop and a 2-0 silk tie respectively.  Finally, the internal carotid was carefully dissected free.  An umbilical tape was placed around the internal carotid artery distal to the diseased segment.  The hypoglossal nerve was visualized throughout and protected.  The patient was given systemic  heparinization.  A bovine carotid patch was selected and prepared on the back table.  A 10 french shunt was also prepared.  After blood pressure readings were appropriate and the heparin had been given time to circulate, the internal carotid artery was occluded with a baby Gregory clamp.  The external and common carotid arteries were then occluded with vascular clamps and the 2-0 tie tightened on the superior thyroid artery.  A #11 blade was used to make an arteriotomy in the common carotid artery.  This was extended with Potts scissors along the anterior and lateral border of the common and internal carotid artery.  Approximately 90% stenosis was identified.  There was no thrombus identified.  The 10 french shunt was then placed.  A kleiner kuntz elevator was used to perform endarterectomy.  An eversion endarterectomy was performed in the external carotid artery.  A good distal endpoint was obtained in the internal carotid artery.  The specimen was removed and sent to pathology.  Heparinized saline was used to irrigate the endarterectomized field.  All potential embolic debris was removed.  Bovine pericardial patch angioplasty was then performed using a running 6-0 Prolene. Just prior to completion of the repair, the shunt was removed. The common internal and external carotid arteries were all appropriately flushed. The artery was again irrigated with heparin saline.  The anastomosis was then secured. The clamp was first released on the external carotid artery followed by the common carotid artery approximately 30 seconds later, bloodflow was reestablish through the internal carotid artery.  Next, a hand-held  Doppler was used to evaluate the signals in the common, external, and internal  carotid  arteries, all of which had appropriate signals. I then administered  50 mg protamine. The wound was then irrigated.  After hemostasis was achieved, the carotid sheath was reapproximated with 3-0 Vicryl. The  platysma  muscle was reapproximated with running 3-0 Vicryl. The skin  was closed with 4-0 Vicryl. Dermabond was placed on the skin. The  patient was then successfully extubated. His neurologic exam was  similar to his preprocedural exam. The patient was then taken to recovery room  in stable condition. There were no complications.     Disposition:  To PACU in stable condition.  Relevant Operative Details:  The lesion was surrounded by dense inflammatory tissue.  The distal extent of the disease was approximately 3 cm beyond the carotid bifurcation.  I had to fully mobilize the hypoglossal nerve to get to the end of the plaque.  In addition the disease within the common carotid artery went down to the clavicle.  A 8 JamaicaFrench shunt was used for the whole procedure.  There was poor backbleeding when the retrograde flow from the internal carotid artery was evaluated.  I ended up closing the common carotid artery primarily and then using a bovine pericardial patch for patch angioplasty of the internal carotid artery extending onto the common carotid artery for approximately 1 cm.  Juleen ChinaV. Wells Ameriah Lint, M.D. [facility] [place] Office: [phone] Pager:  [phone]

## 2014-12-11 NOTE — Progress Notes (Addendum)
     Patient complains of pain at the incision otherwise he is doing well No tongue deviation, smile is symmetric and grip is 5/5 equal bilaterally Incision C/D/I without hematoma  S/P right CEA Disposition stable  COLLINS, EMMA MAUREEN PA-C  Agree with aboce.  Resting comfortable in bed with family Incision soft Neuro intact Anticipate d/c in the am  Wells Aleda Madl

## 2014-12-11 NOTE — Anesthesia Postprocedure Evaluation (Signed)
Anesthesia Post Note  Patient: [name]  Procedure(s) Performed: Procedure(s) (LRB): RIGHT CAROTID ENDARTERECTOMY WITH BOVINE PERICARDIAL PATCH ANGIOPLASTY (Right)  Anesthesia type: General  Patient location: PACU  Post pain: Pain level controlled  Post assessment: Post-op Vital signs reviewed  Last Vitals: BP 125/67 mmHg  Pulse 67  Temp(Src) 36.4 C (Oral)  Resp 17  Ht 5\' 7"  (1.702 m)  Wt 169 lb 8 oz (76.885 kg)  BMI 26.54 kg/m2  SpO2 96%  Post vital signs: Reviewed  Level of consciousness: sedated  Complications: No apparent anesthesia complications

## 2014-12-11 NOTE — H&P (Signed)
Patient name: Terry ChurnJames M HendrixMRN: [id number] DOB: 02/13/1947Sex: male   Referred by: Dr. Allyson [name]  Reason for referral:  Chief Complaint  Patient presents with  . Carotid    Referred by Dr. Allyson [name] for right carotid stenosis    HISTORY OF PRESENT ILLNESS: This is a very pleasant 69 year old gentleman who is referred today for evaluation of an asymptomatic right carotid stenosis. This was detected initially on Doppler ultrasound and has been followed. Recently progressed to greater than 80%. He denies numbness or weakness in either extremity. He denies slurred speech. He denies amaurosis fugax. He does endorse several episodes of dizziness and near fainting. The most recent of which was 2 weeks ago.  The patient has a history of coronary artery disease. He is status post CABG in 2009. He had a free LIMA to the LAD given his left subclavian occlusion. He suffers from hypercholesterolemia which is managed with a statin. His blood pressure is managed with an ACE inhibitor. He continues to smoke but is trying to quit. He is on dual antiplatelet therapy. He recently underwent a nuclear stress test which showed an ejection fraction of 34% there was no significant reversible findings.  Past Medical History  Diagnosis Date  . CAD s/p CABG 2009 10/31/2012    Free LIMA to LAD (left subclavian artery occlusion), SVG to PDA, SVG to oblique marginal, SVG to ramus intermedius. Previous stents in the LAD artery and left circumflex coronary artery   . HTN (hypertension) 10/31/2012  . Hyperlipidemia 10/31/2012  . 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 Bilateral carotid artery disease [facility](HCC)     Past Surgical History  Procedure Laterality Date  . Coronary artery bypass graft  2009  . Left heart catheterization with coronary/graft angiogram N/A 06/08/2011    Procedure: LEFT HEART  CATHETERIZATION WITH Isabel CapriceORONARY/GRAFT ANGIOGRAM; Surgeon: Thurmon FairMihai Croitoru, MD; Location: [facility] CATH LAB; Service: Cardiovascular; Laterality: N/A;    Social History   Social History  . Marital Status: Legally Separated    Spouse Name: N/A  . Number of Children: N/A  . Years of Education: N/A   Occupational History  . Not on file.   Social History Main Topics  . Smoking status: Current Some Day Smoker -- 0.10 packs/day    Types: Cigarettes  . Smokeless tobacco: Not on file  . Alcohol Use: Yes     Comment: occas.  . Drug Use: No  . Sexual Activity: Not on file   Other Topics Concern  . Not on file   Social History Narrative    Family History  Problem Relation Age of Onset  . Congestive Heart Failure Mother   . Stroke Father   . Heart attack Maternal Grandmother   . Heart attack Maternal Grandfather     Allergies as of 11/10/2014  . (No Known Allergies)    Current Outpatient Prescriptions on File Prior to Visit  Medication Sig Dispense Refill  . aspirin EC 81 MG tablet Take 81 mg by mouth daily.    . carvedilol (COREG) 3.125 MG tablet TAKE ONE TABLET TWICE DAILY 60 tablet 9  . clopidogrel (PLAVIX) 75 MG tablet Take 1 tablet (75 mg total) by mouth daily. 30 tablet 11  . isosorbide mononitrate (IMDUR) 30 MG 24 hr tablet TAKE ONE TABLET ONE HOUR BEFORE BEDTIME 30 tablet 7  . lisinopril (PRINIVIL,ZESTRIL) 5 MG tablet TAKE ONE (1) TABLET EACH DAY 30 tablet 8  . nitroGLYCERIN (NITROSTAT) 0.4 MG SL tablet  Place 1 tablet (0.4 mg total) under the tongue every 5 (five) minutes as needed for chest pain. 25 tablet 6  . pantoprazole (PROTONIX) 40 MG tablet TAKE ONE (1) TABLET BY MOUTH EVERY DAY 30 tablet 9  . RANEXA 1000 MG SR tablet TAKE ONE TABLET BY MOUTH TWICE A DAY 60 each 5  . simvastatin (ZOCOR) 20 MG tablet Take 1 tablet (20 mg total) by  mouth every evening. 30 tablet 8   No current facility-administered medications on file prior to visit.     REVIEW OF SYSTEMS: Please see history of present illness, otherwise negative  PHYSICAL EXAMINATION:  Filed Vitals:   11/10/14 0839  BP: 137/82  Pulse: 57  Temp: 97.6 F (36.4 C)  TempSrc: Oral  Resp: 16  Height:  (1.702 m)  Weight: 169 lb (76.658 kg)  SpO2: 98%   Body mass index is 26.46 kg/(m^2). General: The patient appears their stated age.  HEENT: No gross abnormalities Pulmonary: Respirations are non-labored Abdomen: Soft and non-tender  Musculoskeletal: There are no major deformities.  Neurologic: No focal weakness or paresthesias are detected, Skin: There are no ulcer or rashes noted. Psychiatric: The patient has normal affect. Cardiovascular: There is a regular rate and rhythm without significant murmur appreciated.  Diagnostic Studies: Nuclear stress echo:  Nuclear stress EF: 34%.  The left ventricular ejection fraction is moderately decreased (30-44%).  There was no ST segment deviation noted during stress. Resting ST segment abnormalities did not change with stress.  Defect 1: There is a medium defect of moderate severity present in the basal inferolateral and mid inferolateral location.  This is a high risk study.  There is a scar in the inferolateral myocardium. There is no significant reversibility. SDS=3. There is significant LV systolic dysfunction with EF 34% and inferior wall hypokinesis.  Carotid Doppler:  Greater than 80% right carotid stenosis. 1 no history percent left carotid stenosis  Bifurcation is mid hyoid   Assessment:  Asymptomatic right carotid stenosis Plan: I discussed our treatment options which include stenting versus carotid endarterectomy. We have decided to proceed with right carotid endarterectomy. The risks and benefits of the operation were discussed with the patient  including the risk of stroke, nerve injury, and facial numbness as well as cardio pulmonary complications. All his questions were answered. He would like to proceed after November 11. I would like for him to be off his Plavix for 5 days. He is been scheduled for Thursday, November 17.        Remains asymptomatic CV:RRR Pulm:  CTA Abd soft Plan:  R CEA

## 2014-12-12 ENCOUNTER — Telehealth: Payer: Self-pay | Admitting: Surgery

## 2014-12-12 ENCOUNTER — Encounter (HOSPITAL_COMMUNITY): Payer: Self-pay | Admitting: Surgery

## 2014-12-12 DIAGNOSIS — I6521 Occlusion and stenosis of right carotid artery: Secondary | ICD-10-CM | POA: Diagnosis not present

## 2014-12-12 DIAGNOSIS — Z7902 Long term (current) use of antithrombotics/antiplatelets: Secondary | ICD-10-CM | POA: Diagnosis not present

## 2014-12-12 DIAGNOSIS — I2510 Atherosclerotic heart disease of native coronary artery without angina pectoris: Secondary | ICD-10-CM | POA: Diagnosis not present

## 2014-12-12 DIAGNOSIS — F17210 Nicotine dependence, cigarettes, uncomplicated: Secondary | ICD-10-CM | POA: Diagnosis not present

## 2014-12-12 DIAGNOSIS — E785 Hyperlipidemia, unspecified: Secondary | ICD-10-CM | POA: Diagnosis not present

## 2014-12-12 DIAGNOSIS — Z951 Presence of aortocoronary bypass graft: Secondary | ICD-10-CM | POA: Diagnosis not present

## 2014-12-12 DIAGNOSIS — Z7982 Long term (current) use of aspirin: Secondary | ICD-10-CM | POA: Diagnosis not present

## 2014-12-12 DIAGNOSIS — I10 Essential (primary) hypertension: Secondary | ICD-10-CM | POA: Diagnosis not present

## 2014-12-12 DIAGNOSIS — Z23 Encounter for immunization: Secondary | ICD-10-CM | POA: Diagnosis not present

## 2014-12-12 LAB — CBC
HCT: 32 % — ABNORMAL LOW (ref 39.0–52.0)
HEMOGLOBIN: 10.9 g/dL — AB (ref 13.0–17.0)
MCH: 35.3 pg — AB (ref 26.0–34.0)
MCHC: 34.1 g/dL (ref 30.0–36.0)
MCV: 103.6 fL — AB (ref 78.0–100.0)
Platelets: 171 10*3/uL (ref 150–400)
RBC: 3.09 MIL/uL — ABNORMAL LOW (ref 4.22–5.81)
RDW: 13.4 % (ref 11.5–15.5)
WBC: 5.7 10*3/uL (ref 4.0–10.5)

## 2014-12-12 LAB — BASIC METABOLIC PANEL
ANION GAP: 3 — AB (ref 5–15)
BUN: 12 mg/dL (ref 6–20)
CHLORIDE: 108 mmol/L (ref 101–111)
CO2: 26 mmol/L (ref 22–32)
Calcium: 8 mg/dL — ABNORMAL LOW (ref 8.9–10.3)
Creatinine, Ser: 1.1 mg/dL (ref 0.61–1.24)
GFR calc Af Amer: >60 mL/min (ref >60)
GFR calc non Af Amer: >60 mL/min (ref >60)
GLUCOSE: 89 mg/dL (ref 65–99)
POTASSIUM: 3.9 mmol/L (ref 3.5–5.1)
Sodium: 137 mmol/L (ref 135–145)

## 2014-12-12 MED ORDER — OXYCODONE-ACETAMINOPHEN 5-325 MG PO TABS: 1.0000 | ORAL_TABLET | ORAL | Status: DC | PRN

## 2014-12-12 MED ORDER — CLOPIDOGREL BISULFATE 75 MG PO TABS: 75.0000 mg | ORAL_TABLET | Freq: Every day | ORAL | Status: DC

## 2014-12-12 NOTE — Progress Notes (Signed)
  [facility] Progress Note  Subjective  - POD #1  No complaints. Ready to go home.   Objective Filed Vitals:   12/12/14 0400  BP: 148/75  Pulse: 64  Temp: 97.3 F (36.3 C)  Resp: 17    Intake/Output Summary (Last 24 hours) at 12/12/14 0725 Last data filed at 12/12/14 0600  Gross per 24 hour  Intake   3790 ml  Output   1350 ml  Net   2440 ml   Right neck incision no hematoma 5/5 strength upper and lower extremities Slight right marginal mandibular neuropraxia.  Tongue midline  Assessment/Planning: 69 y.o. male is s/p: right carotid endarterectomy 1 Day Post-Op   Neuro exam intact except for right marginal mandibular neuropraxia. Explained that this will resolve with time.  Neck no hematoma. Restart plavix today.  Has ambulated and voided. Tolerating diet well. Modified rankin: 0 VQI: on ASA and statin.  Discharge home today. Follow up in 2 weeks.   [name] 12/12/2014 7:25 AM --  Laboratory CBC    Component Value Date/Time   WBC 5.7 12/12/2014 0440   HGB 10.9* 12/12/2014 0440   HCT 32.0* 12/12/2014 0440   PLT 171 12/12/2014 0440    BMET    Component Value Date/Time   NA 137 12/12/2014 0440   K 3.9 12/12/2014 0440   CL 108 12/12/2014 0440   CO2 26 12/12/2014 0440   GLUCOSE 89 12/12/2014 0440   BUN 12 12/12/2014 0440   CREATININE 1.10 12/12/2014 0440   CALCIUM 8.0* 12/12/2014 0440   GFRNONAA >60 12/12/2014 0440   GFRAA >60 12/12/2014 0440    COAG Lab Results  Component Value Date   INR 1.04 12/03/2014   INR 1.3 08/24/2007   INR 1.0 08/20/2007   No results found for: PTT  Antibiotics Anti-infectives    Start     Dose/Rate Route Frequency Ordered Stop   12/11/14 1930  cefUROXime (ZINACEF) 1.5 g in dextrose 5 % 50 mL IVPB     1.5 g 100 mL/hr over 30 Minutes Intravenous Every 12 hours 12/11/14 1359 12/12/14 1929   12/11/14 0645  cefUROXime (ZINACEF) 1.5 g in dextrose 5 % 50 mL IVPB     1.5 g 100 mL/hr over 30  Minutes Intravenous To [facility] 12/10/14 1159 12/11/14 0750       Maris BergerKimberly Kie Calvin, PA-C [facility] Office: [phone] Pager: [phone] 12/12/2014 7:25 AM

## 2014-12-12 NOTE — Progress Notes (Signed)
Explained and discussed discharge instructions, f/u appt.,prescriptions given. Pt discharged home via w/c with family. No complaints voiced at this time.

## 2014-12-12 NOTE — Telephone Encounter (Signed)
-----   Message from Phillips Odorarol S Pullins, RN sent at 12/12/2014 12:43 PM EST ----- Regarding: needs 2 week f/u with Dr. Myra GianottiBrabham/ s/p (R) CEA   ----- Message -----    From: Raymond GurneyKimberly A Trinh, PA-C    Sent: 12/12/2014   7:28 AM      To: [facility] Charge Pool  S/p right CEA 12/11/14  F/u with Dr. Myra GianottiBrabham in 2 weeks.   Thanks Selena BattenKim

## 2014-12-15 NOTE — Discharge Summary (Signed)
[facility] Discharge Summary  [name] 14-May-1945 69 y.o. male  [id number]  Admission Date: 12/11/2014  Discharge Date: 12/12/2014  Physician: Coral Else, MD  Admission Diagnosis: Right carotid stenosis  HPI:   This is a 69 y.o. male who was referred  for evaluation of an asymptomatic right carotid stenosis. This was detected initially on Doppler ultrasound and has been followed. Recently progressed to greater than 80%. He denies numbness or weakness in either extremity. He denies slurred speech. He denies amaurosis fugax. He does endorse several episodes of dizziness and near fainting. The most recent of which was 2 weeks ago.  Hospital Course:  The patient was admitted to the hospital and taken to the operating room on 12/11/2014 and underwent right carotid endarterectomy with bovine pericardial patch angioplasty.  The patient tolerated the procedure well and was transported to the PACU in stable condition.  By POD 1, the patient's neuro status was intact except for a mild right marginal mandibular neuropraxia. His right neck incision was clean and intact without hematoma. He was ambulating, voiding and tolerating a diet without difficulty. He was discharged home on POD 1 in good condition.    No results for input(s): NA, K, CL, CO2, GLUCOSE, BUN, CALCIUM in the last 72 hours.  Invalid input(s): CRATININE No results for input(s): WBC, HGB, HCT, PLT in the last 72 hours. No results for input(s): INR in the last 72 hours.  Discharge Instructions:   The patient is discharged to home with extensive instructions on wound care and progressive ambulation.  They are instructed not to drive or perform any heavy lifting until returning to see the physician in his office.  Discharge Instructions    CAROTID Sugery: Call MD for difficulty swallowing or speaking; weakness in arms or legs that is a new symtom; severe headache.  If you have increased  swelling in the neck and/or  are having difficulty breathing, CALL [phone]    Complete by:  As directed      Call MD for:  redness, tenderness, or signs of infection (pain, swelling, bleeding, redness, odor or green/yellow discharge around incision site)    Complete by:  As directed      Call MD for:  severe or increased pain, loss or decreased feeling  in affected limb(s)    Complete by:  As directed      Call MD for:  temperature >100.5    Complete by:  As directed      Discharge wound care:    Complete by:  As directed   May shower and wash right neck wound daily with soap and water. Do not peel the skin glue off. This will come off on its own.     Driving Restrictions    Complete by:  As directed   No driving for 1 week     Increase activity slowly    Complete by:  As directed   Walk with assistance use walker or cane as needed     Lifting restrictions    Complete by:  As directed   No lifting for 2 weeks     Resume previous diet    Complete by:  As directed            Discharge Diagnosis:  Right carotid stenosis  Secondary Diagnosis: Patient Active Problem List   Diagnosis Date Noted  . Asymptomatic stenosis of right carotid artery 12/11/2014  . Carotid artery disease (HCC) 10/15/2014  . Prolonged Q-T  interval on ECG 08/11/2013  . Syncope 08/10/2013  . CAD s/p CABG 2009 10/31/2012  . 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 Hyperlipidemia 10/31/2012  . HTN (hypertension) 10/31/2012  . Tobacco abuse 10/31/2012   Past Medical History  Diagnosis Date  . CAD s/p CABG 2009 10/31/2012    Free LIMA to LAD (left subclavian artery occlusion), SVG to PDA, SVG to oblique marginal, SVG to ramus intermedius. Previous stents in the LAD artery and left circumflex coronary artery   . HTN (hypertension) 10/31/2012  . Hyperlipidemia 10/31/2012  . 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 Bilateral carotid artery disease  (HCC)   . Complication of anesthesia     pt. states that he has a difficult time breathing when waking up from anesthesia  . Stroke [facility](HCC)     pt. states that he was told he had a stroke many years ago but states no deficits.   . History of anxiety   . History of depression   . Urinary urgency       Medication List    TAKE these medications        aspirin EC 81 MG tablet  Take 81 mg by mouth daily.     carvedilol 3.125 MG tablet  Commonly known as:  COREG  TAKE ONE TABLET TWICE DAILY     clopidogrel 75 MG tablet  Commonly known as:  PLAVIX  Take 1 tablet (75 mg total) by mouth daily.     isosorbide mononitrate 30 MG 24 hr tablet  Commonly known as:  IMDUR  TAKE ONE TABLET ONE HOUR BEFORE BEDTIME     lisinopril 5 MG tablet  Commonly known as:  PRINIVIL,ZESTRIL  TAKE ONE (1) TABLET EACH DAY     nitroGLYCERIN 0.4 MG SL tablet  Commonly known as:  NITROSTAT  Place 1 tablet (0.4 mg total) under the tongue every 5 (five) minutes as needed for chest pain.     oxyCODONE-acetaminophen 5-325 MG tablet  Commonly known as:  PERCOCET/ROXICET  Take 1-2 tablets by mouth every 4 (four) hours as needed for moderate pain.     pantoprazole 40 MG tablet  Commonly known as:  PROTONIX  TAKE ONE (1) TABLET EACH DAY     RANEXA 1000 MG SR tablet  Generic drug:  ranolazine  TAKE ONE TABLET BY MOUTH TWICE A DAY     simvastatin 20 MG tablet  Commonly known as:  ZOCOR  Take 1 tablet (20 mg total) by mouth every evening.        Percocet #20 No Refill  Disposition: Home  Patient's condition: is Good  Follow up: 1. Dr.  Myra GianottiBrabham in 2 weeks.   Maris BergerKimberly Kanita Delage, PA-C [facility] [phone]  --- For [facility] use --- Instructions: Press F2 to tab through selections.  Delete question if not applicable.   Modified Rankin score at D/C (0-6): 0  IV medication needed for:  1. Hypertension: No 2. Hypotension: No  Post-op Complications: No  1. Post-op CVA or  TIA: No  2. CN injury: No  3. Myocardial infarction: No  4.  CHF: No  5.  Dysrhythmia (new): No  6. Wound infection: No  7. Reperfusion symptoms: No  8. Return to OR: No  Discharge medications: Statin use:  Yes If No: [ ]  For Medical reasons, [ ]  Non-compliant, [ ]  Not-indicated ASA use:  Yes  If No: [ ]   For Medical reasons,  Non-compliant,  Not-indicated Beta blocker use:  Yes If No:  For Medical reasons,  Non-compliant,  Not-indicated ACE-Inhibitor use:  Yes If No:  For Medical reasons,  Non-compliant,  Not-indicated P2Y12 Antagonist use: Yes,  Plavix,  Plasugrel,  Ticlopinine,  Ticagrelor,  Other,  No for medical reason,  Non-compliant,  Not-indicated Anti-coagulant use:  No,  Warfarin,  Rivaroxaban,  Dabigatran,  Other,  No for medical reason,  Non-compliant,  Not-indicated

## 2014-12-16 ENCOUNTER — Encounter: Payer: Self-pay | Admitting: Surgery

## 2014-12-22 ENCOUNTER — Encounter: Payer: Self-pay | Admitting: Surgery

## 2014-12-22 ENCOUNTER — Ambulatory Visit (INDEPENDENT_AMBULATORY_CARE_PROVIDER_SITE_OTHER): Payer: Commercial Managed Care - HMO | Admitting: Surgery

## 2014-12-22 VITALS — BP 120/75 | HR 77 | Temp 97.9°F | Resp 18 | Ht 67.0 in | Wt 170.2 lb

## 2014-12-22 DIAGNOSIS — I6522 Occlusion and stenosis of left carotid artery: Secondary | ICD-10-CM

## 2014-12-22 NOTE — Progress Notes (Signed)
  POST OPERATIVE OFFICE NOTE    CC:  F/u for surgery  HPI:  This is a 69 y.o. male who is s/p Right CEA  Allergies  Allergen Reactions  . Oxycodone-Acetaminophen     Rash  "facial blotches"     Current Outpatient Prescriptions  Medication Sig Dispense Refill  . aspirin EC 81 MG tablet Take 81 mg by mouth daily.    . carvedilol (COREG) 3.125 MG tablet TAKE ONE TABLET TWICE DAILY 60 tablet 9  . clopidogrel (PLAVIX) 75 MG tablet Take 1 tablet (75 mg total) by mouth daily. 30 tablet 11  . isosorbide mononitrate (IMDUR) 30 MG 24 hr tablet TAKE ONE TABLET ONE HOUR BEFORE BEDTIME 30 tablet 7  . lisinopril (PRINIVIL,ZESTRIL) 5 MG tablet TAKE ONE (1) TABLET EACH DAY 30 tablet 6  . nitroGLYCERIN (NITROSTAT) 0.4 MG SL tablet Place 1 tablet (0.4 mg total) under the tongue every 5 (five) minutes as needed for chest pain. 25 tablet 8  . pantoprazole (PROTONIX) 40 MG tablet TAKE ONE (1) TABLET EACH DAY 30 tablet 11  . RANEXA 1000 MG SR tablet TAKE ONE TABLET BY MOUTH TWICE A DAY 60 each 5  . simvastatin (ZOCOR) 20 MG tablet Take 1 tablet (20 mg total) by mouth every evening. 30 tablet 8  . oxyCODONE-acetaminophen (PERCOCET/ROXICET) 5-325 MG tablet Take 1-2 tablets by mouth every 4 (four) hours as needed for moderate pain. (Patient not taking: Reported on 12/22/2014) 20 tablet 0   No current facility-administered medications for this visit.     ROS:  See HPI  Physical Exam:  Filed Vitals:   12/22/14 1359  BP: 120/75  Pulse: 77  Temp: 97.9 F (36.6 C)  Resp: 18    Incision:  Well healing without erythema or edema Extremities:  Palpable radial pulse, grip 5/5 Heart RRR    Assessment/Plan:  This is a 69 y.o. male who is s/p: Right CEA 2 weeks post-op  He states he feels great and he is trying to stop smoking.   He will f/u with Dr. Myra GianottiBrabham in 9 months for a repeat Bilateral carotid duplex.  Thomasena EdisCOLLINS, [facility] CenterEMMA [facility]MAUREEN  [facility] [phone]  Clinic MD:  Pt seen  and examined with Dr. Myra GianottiBrabham    The patient is back for follow-up. He is status post right carotid endarterectomy on 12/11/2014.  This was done for asymptomatic stenosis.  Intraoperative findings included a 90% stenosis.  The patient had a very difficult procedure.  His lesion was surrounded by dense inflammatory tissue.  The distal extent of the disease was approximately 3 cm  Beyond the carotid bifurcation. It also went down to the clavicle.  The common carotid artery was closed primarily and ovine pericardial patch angioplasty was performed of the internal carotid and distal common carotid artery.  The patient's postoperative course was uncomplicated.  He is back today without complaints.  He is neurologically intact.  His incision is healing nicely.  The patient will follow-up in 9 months with repeat carotid ultrasound

## 2014-12-22 NOTE — Addendum Note (Signed)
Addended by: Adria DillELDRIDGE-LEWIS, Rustin Erhart L on: 12/22/2014 04:30 PM   Modules accepted: Orders

## 2015-01-05 ENCOUNTER — Other Ambulatory Visit: Payer: Self-pay | Admitting: Cardiovascular Disease

## 2015-01-05 NOTE — Telephone Encounter (Signed)
Rx request sent to pharmacy.  

## 2015-01-13 ENCOUNTER — Ambulatory Visit: Payer: Commercial Managed Care - HMO | Admitting: Cardiovascular Disease

## 2015-03-07 ENCOUNTER — Emergency Department (HOSPITAL_COMMUNITY): Payer: Commercial Managed Care - HMO

## 2015-03-07 ENCOUNTER — Emergency Department (HOSPITAL_COMMUNITY)
Admission: EM | Admit: 2015-03-07 | Discharge: 2015-03-07 | Disposition: A | Discharge status: Home / Self Care | Payer: Commercial Managed Care - HMO | Attending: Emergency Medicine | Admitting: Emergency Medicine

## 2015-03-07 ENCOUNTER — Encounter (HOSPITAL_COMMUNITY): Payer: Self-pay

## 2015-03-07 DIAGNOSIS — E785 Hyperlipidemia, unspecified: Secondary | ICD-10-CM | POA: Insufficient documentation

## 2015-03-07 DIAGNOSIS — Z8659 Personal history of other mental and behavioral disorders: Secondary | ICD-10-CM | POA: Diagnosis not present

## 2015-03-07 DIAGNOSIS — E860 Dehydration: Secondary | ICD-10-CM

## 2015-03-07 DIAGNOSIS — I10 Essential (primary) hypertension: Secondary | ICD-10-CM | POA: Insufficient documentation

## 2015-03-07 DIAGNOSIS — J441 Chronic obstructive pulmonary disease with (acute) exacerbation: Secondary | ICD-10-CM | POA: Diagnosis not present

## 2015-03-07 DIAGNOSIS — F17210 Nicotine dependence, cigarettes, uncomplicated: Secondary | ICD-10-CM | POA: Diagnosis not present

## 2015-03-07 DIAGNOSIS — Z79899 Other long term (current) drug therapy: Secondary | ICD-10-CM | POA: Diagnosis not present

## 2015-03-07 DIAGNOSIS — R0602 Shortness of breath: Secondary | ICD-10-CM | POA: Diagnosis not present

## 2015-03-07 DIAGNOSIS — R197 Diarrhea, unspecified: Secondary | ICD-10-CM | POA: Insufficient documentation

## 2015-03-07 DIAGNOSIS — Z7902 Long term (current) use of antithrombotics/antiplatelets: Secondary | ICD-10-CM | POA: Diagnosis not present

## 2015-03-07 DIAGNOSIS — Z951 Presence of aortocoronary bypass graft: Secondary | ICD-10-CM | POA: Insufficient documentation

## 2015-03-07 DIAGNOSIS — R3915 Urgency of urination: Secondary | ICD-10-CM | POA: Insufficient documentation

## 2015-03-07 DIAGNOSIS — R5383 Other fatigue: Secondary | ICD-10-CM | POA: Diagnosis present

## 2015-03-07 DIAGNOSIS — I2510 Atherosclerotic heart disease of native coronary artery without angina pectoris: Secondary | ICD-10-CM | POA: Diagnosis not present

## 2015-03-07 DIAGNOSIS — Z9889 Other specified postprocedural states: Secondary | ICD-10-CM | POA: Insufficient documentation

## 2015-03-07 DIAGNOSIS — Z7982 Long term (current) use of aspirin: Secondary | ICD-10-CM | POA: Insufficient documentation

## 2015-03-07 DIAGNOSIS — Z8673 Personal history of transient ischemic attack (TIA), and cerebral infarction without residual deficits: Secondary | ICD-10-CM | POA: Diagnosis not present

## 2015-03-07 LAB — URINALYSIS, ROUTINE W REFLEX MICROSCOPIC
BILIRUBIN URINE: NEGATIVE
Glucose, UA: NEGATIVE mg/dL
LEUKOCYTES UA: NEGATIVE
NITRITE: NEGATIVE
PH: 6 (ref 5.0–8.0)
Protein, ur: 30 mg/dL — AB
SPECIFIC GRAVITY, URINE: 1.015 (ref 1.005–1.030)

## 2015-03-07 LAB — URINE MICROSCOPIC-ADD ON

## 2015-03-07 LAB — C DIFFICILE QUICK SCREEN W PCR REFLEX
C DIFFICILE (CDIFF) INTERP: NEGATIVE
C DIFFICILE (CDIFF) TOXIN: NEGATIVE
C DIFFICLE (CDIFF) ANTIGEN: NEGATIVE

## 2015-03-07 LAB — BASIC METABOLIC PANEL
ANION GAP: 13 (ref 5–15)
ANION GAP: 8 (ref 5–15)
BUN: 20 mg/dL (ref 6–20)
BUN: 20 mg/dL (ref 6–20)
CALCIUM: 8.4 mg/dL — AB (ref 8.9–10.3)
CHLORIDE: 103 mmol/L (ref 101–111)
CO2: 24 mmol/L (ref 22–32)
CO2: 25 mmol/L (ref 22–32)
CREATININE: 1.68 mg/dL — AB (ref 0.61–1.24)
CREATININE: 1.57 mg/dL — AB (ref 0.61–1.24)
Calcium: 7.4 mg/dL — ABNORMAL LOW (ref 8.9–10.3)
Chloride: 97 mmol/L — ABNORMAL LOW (ref 101–111)
GFR calc non Af Amer: 44 mL/min — ABNORMAL LOW (ref >60)
GFR, EST AFRICAN AMERICAN: 47 mL/min — AB (ref >60)
GFR, EST AFRICAN AMERICAN: 51 mL/min — AB (ref >60)
GFR, EST NON AFRICAN AMERICAN: 40 mL/min — AB (ref >60)
Glucose, Bld: 102 mg/dL — ABNORMAL HIGH (ref 65–99)
Glucose, Bld: 88 mg/dL (ref 65–99)
Potassium: 3.8 mmol/L (ref 3.5–5.1)
Potassium: 3.9 mmol/L (ref 3.5–5.1)
SODIUM: 134 mmol/L — AB (ref 135–145)
SODIUM: 136 mmol/L (ref 135–145)

## 2015-03-07 LAB — CBC WITH DIFFERENTIAL/PLATELET
BASOS ABS: 0 10*3/uL (ref 0.0–0.1)
BASOS PCT: 0 %
EOS ABS: 0 10*3/uL (ref 0.0–0.7)
Eosinophils Relative: 0 %
HEMATOCRIT: 45.9 % (ref 39.0–52.0)
Hemoglobin: 15.2 g/dL (ref 13.0–17.0)
Lymphocytes Relative: 31 %
Lymphs Abs: 3.5 10*3/uL (ref 0.7–4.0)
MCH: 33.6 pg (ref 26.0–34.0)
MCHC: 33.1 g/dL (ref 30.0–36.0)
MCV: 101.3 fL — ABNORMAL HIGH (ref 78.0–100.0)
MONO ABS: 0.9 10*3/uL (ref 0.1–1.0)
MONOS PCT: 8 %
NEUTROS ABS: 6.9 10*3/uL (ref 1.7–7.7)
NEUTROS PCT: 61 %
Platelets: 166 10*3/uL (ref 150–400)
RBC: 4.53 MIL/uL (ref 4.22–5.81)
RDW: 14.3 % (ref 11.5–15.5)
WBC: 11.3 10*3/uL — ABNORMAL HIGH (ref 4.0–10.5)

## 2015-03-07 MED ORDER — SODIUM CHLORIDE 0.9 % IV BOLUS (SEPSIS)
2000.0000 mL | Freq: Once | INTRAVENOUS | Status: AC
  Administered 2015-03-07: 2000 mL via INTRAVENOUS

## 2015-03-07 MED ORDER — DIPHENOXYLATE-ATROPINE 2.5-0.025 MG PO TABS: 1.0000 | ORAL_TABLET | Freq: Four times a day (QID) | ORAL | Status: DC | PRN

## 2015-03-07 MED ORDER — PREDNISONE 20 MG PO TABS: 20.0000 mg | ORAL_TABLET | Freq: Two times a day (BID) | ORAL | Status: DC

## 2015-03-07 MED ORDER — METHYLPREDNISOLONE SODIUM SUCC 125 MG IJ SOLR
125.0000 mg | Freq: Once | INTRAMUSCULAR | Status: AC
  Administered 2015-03-07: 125 mg via INTRAVENOUS
  Filled 2015-03-07: qty 2

## 2015-03-07 MED ORDER — SODIUM CHLORIDE 0.9 % IV BOLUS (SEPSIS)
500.0000 mL | Freq: Once | INTRAVENOUS | Status: AC
  Administered 2015-03-07: 500 mL via INTRAVENOUS

## 2015-03-07 MED ORDER — DIPHENOXYLATE-ATROPINE 2.5-0.025 MG PO TABS
2.0000 | ORAL_TABLET | Freq: Once | ORAL | Status: AC
  Administered 2015-03-07: 2 via ORAL
  Filled 2015-03-07: qty 2

## 2015-03-07 MED ORDER — ALBUTEROL SULFATE HFA 108 (90 BASE) MCG/ACT IN AERS: 1.0000 | INHALATION_SPRAY | Freq: Four times a day (QID) | RESPIRATORY_TRACT | Status: DC | PRN

## 2015-03-07 MED ORDER — ACETAMINOPHEN 500 MG PO TABS
1000.0000 mg | ORAL_TABLET | Freq: Once | ORAL | Status: AC
  Administered 2015-03-07: 1000 mg via ORAL
  Filled 2015-03-07: qty 2

## 2015-03-07 MED ORDER — ALBUTEROL (5 MG/ML) CONTINUOUS INHALATION SOLN
10.0000 mg/h | INHALATION_SOLUTION | Freq: Once | RESPIRATORY_TRACT | Status: AC
  Administered 2015-03-07: 10 mg/h via RESPIRATORY_TRACT
  Filled 2015-03-07: qty 20

## 2015-03-07 NOTE — ED Notes (Addendum)
Pt was off O2 after going to restroom. O2 dropped to 83% RA. O2 reapplied at 3L with O2 sat of 94% [place], Pt was initially 88% RA on arrival.

## 2015-03-07 NOTE — ED Notes (Signed)
Per son, pt has not been eating or drinking. States he having difficulty walking to the bathroom due to weakness

## 2015-03-07 NOTE — ED Provider Notes (Signed)
CSN: [id number]     Arrival date & time 03/07/15  0803 History  By signing my name below, I, [name], attest that this documentation has been prepared under the direction and in the presence of Rolland Porter, MD. Electronically Signed: Ronney [name], ED Scribe. 03/07/2015. 2:44 PM.   Chief Complaint  Patient presents with  . Fatigue   The history is provided by the patient and a relative. No language interpreter was used.    HPI Comments: [name] is a 70 y.o. male with a history of CABG x 4 in 2009 (no complications since, per pt), HTN, HLD, PAD, CAD, CVA, anxiety, and depression, who presents to the Emergency Department with multiple complaints, including constant, generalized weakness that began due to symptoms developing over the past several weeks. Patient also complains of diarrhea that began several weeks ago and acutely worsened 1 week ago - he states at that time, "it just started shooting out of me quicker." He notes 4-5 episodes of diarrhea per day. Other associated symptoms including dizziness that began 4 days ago, urinary urgency and incontinence that began several weeks ago, occurring daily, back pain that began yesterday, which has since resolved, and fever one night in the past week, per son. Patient notes a history of back surgery but states his pain yesterday was more severe than usual. Patient reports drinking 4 alcoholic drinks per day although he has not had any drinks in the past week. He denies a history of DM. He denies blood in his stool, vomiting, abdominal pain, or difficulty urinating.  Past Medical History  Diagnosis Date  . CAD s/p CABG 2009 10/31/2012    Free LIMA to LAD (left subclavian artery occlusion), SVG to PDA, SVG to oblique marginal, SVG to ramus intermedius. Previous stents in the LAD artery and left circumflex coronary artery   . HTN (hypertension) 10/31/2012  . Hyperlipidemia 10/31/2012  . 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 Bilateral carotid artery disease (HCC)   . Complication of anesthesia     pt. states that he has a difficult time breathing when waking up from anesthesia  . Stroke [facility])     pt. states that he was told he had a stroke many years ago but states no deficits.   . History of anxiety   . History of depression   . Urinary urgency    Past Surgical History  Procedure Laterality Date  . Left heart catheterization with coronary/graft angiogram N/A 06/08/2011    Procedure: LEFT HEART CATHETERIZATION WITH Isabel Caprice;  Surgeon: Thurmon Fair, MD;  Location: [facility] CATH LAB;  Service: Cardiovascular;  Laterality: N/A;  . Rotator cuff repair Bilateral   . Cholecystectomy    . Coronary artery bypass graft  2009  . Colonoscopy    . Endarterectomy Right 12/11/2014    Procedure: RIGHT CAROTID ENDARTERECTOMY WITH BOVINE PERICARDIAL PATCH ANGIOPLASTY;  Surgeon: Nada Libman, MD;  Location: [facility] OR;  Service: Vascular;  Laterality: Right;   Family History  Problem Relation Age of Onset  . Congestive Heart Failure Mother   . Stroke Father   . Heart attack Maternal Grandmother   . Heart attack Maternal Grandfather    Social History  Substance Use Topics  . Smoking status: Current Some Day Smoker -- 0.25 packs/day    Types: Cigarettes  . Smokeless tobacco: None  . Alcohol Use: 2.4 oz/week    4 Cans of beer per week  Comment: daily;     Review of Systems  Constitutional: Positive for fatigue. Negative for fever, chills, diaphoresis and appetite change.  HENT: Negative for mouth sores, sore throat and trouble swallowing.   Eyes: Negative for visual disturbance.  Respiratory: Negative for cough, chest tightness, shortness of breath and wheezing.   Cardiovascular: Negative for chest pain.  Gastrointestinal: Positive for diarrhea. Negative for nausea, vomiting, abdominal pain, blood in stool and abdominal distention.  Endocrine: Negative for polydipsia, polyphagia and  polyuria.  Genitourinary: Positive for urgency. Negative for dysuria, frequency, hematuria and difficulty urinating.  Musculoskeletal: Negative for gait problem.  Skin: Negative for color change, pallor and rash.  Neurological: Positive for dizziness and weakness (generalized). Negative for syncope, light-headedness and headaches.  Hematological: Does not bruise/bleed easily.  Psychiatric/Behavioral: Negative for behavioral problems and confusion.      Allergies  Oxycodone-acetaminophen  Home Medications   Prior to Admission medications   Medication Sig Start Date End Date Taking? Authorizing Provider  aspirin EC 81 MG tablet Take 81 mg by mouth daily.   Yes Historical Provider, MD  carvedilol (COREG) 3.125 MG tablet TAKE ONE TABLET TWICE DAILY 01/05/15  Yes Mihai Croitoru, MD  clopidogrel (PLAVIX) 75 MG tablet Take 1 tablet (75 mg total) by mouth daily. 10/27/14  Yes Mihai Croitoru, MD  isosorbide mononitrate (IMDUR) 30 MG 24 hr tablet TAKE ONE TABLET ONE HOUR BEFORE BEDTIME 07/09/13  Yes Mihai Croitoru, MD  lisinopril (PRINIVIL,ZESTRIL) 5 MG tablet TAKE ONE (1) TABLET EACH DAY 12/11/14  Yes Mihai Croitoru, MD  nitroGLYCERIN (NITROSTAT) 0.4 MG SL tablet Place 1 tablet (0.4 mg total) under the tongue every 5 (five) minutes as needed for chest pain. 11/13/14  Yes Runell Gess, MD  pantoprazole (PROTONIX) 40 MG tablet TAKE ONE (1) TABLET EACH DAY 11/28/14  Yes Mihai Croitoru, MD  RANEXA 1000 MG SR tablet TAKE ONE TABLET BY MOUTH TWICE A DAY 04/29/14  Yes Mihai Croitoru, MD  simvastatin (ZOCOR) 20 MG tablet Take 1 tablet (20 mg total) by mouth every evening. 01/27/14  Yes Mihai Croitoru, MD  albuterol (PROVENTIL HFA;VENTOLIN HFA) 108 (90 Base) MCG/ACT inhaler Inhale 1-2 puffs into the lungs every 6 (six) hours as needed for wheezing. 03/07/15   Rolland Porter, MD  diphenoxylate-atropine (LOMOTIL) 2.5-0.025 MG tablet Take 1 tablet by mouth 4 (four) times daily as needed for diarrhea or loose stools.  03/07/15   Rolland Porter, MD  oxyCODONE-acetaminophen (PERCOCET/ROXICET) 5-325 MG tablet Take 1-2 tablets by mouth every 4 (four) hours as needed for moderate pain. Patient not taking: Reported on 12/22/2014 12/12/14   Raymond Gurney, PA-C  predniSONE (DELTASONE) 20 MG tablet Take 1 tablet (20 mg total) by mouth 2 (two) times daily with a meal. 03/07/15   Rolland Porter, MD   BP 129/77 mmHg  Pulse 69  Temp(Src) 99.5 F (37.5 C) (Oral)  Resp 26  Ht  (1.702 m)  Wt 169 lb (76.658 kg)  BMI 26.46 kg/m2  SpO2 97% Physical Exam  Constitutional: He is oriented to person, place, and time. He appears well-developed and well-nourished. No distress.  HENT:  Head: Normocephalic.  Dry mouth.  Eyes: Conjunctivae are normal. Pupils are equal, round, and reactive to light. No scleral icterus.  Neck: Normal range of motion. Neck supple. No thyromegaly present.  Cardiovascular: Normal rate and regular rhythm.  Exam reveals no gallop and no friction rub.   No murmur heard. Pulmonary/Chest: Effort normal and breath sounds normal. No respiratory distress. He has  no wheezes. He has no rales.  Abdominal: Soft. Bowel sounds are normal. He exhibits no distension. There is no tenderness. There is no rebound.  Musculoskeletal: Normal range of motion.  Neurological: He is alert and oriented to person, place, and time.  Normal neurological exam to bilateral extremities.  Skin: Skin is warm and dry. No rash noted.  Psychiatric: He has a normal mood and affect. His behavior is normal.  Nursing note and vitals reviewed.   ED Course  Procedures (including critical care time)  DIAGNOSTIC STUDIES: Oxygen Saturation is 92% on 2 L/min, adequate by my interpretation.    COORDINATION OF CARE: 8:40 AM - Discussed treatment plan with pt at bedside which includes IV fluids and blood tests. Pt verbalized understanding and agreed to plan.   Labs Review Labs Reviewed  CBC WITH DIFFERENTIAL/PLATELET - Abnormal; Notable  for the following:    WBC 11.3 (*)    MCV 101.3 (*)    All other components within normal limits  BASIC METABOLIC PANEL - Abnormal; Notable for the following:    Sodium 134 (*)    Chloride 97 (*)    Glucose, Bld 102 (*)    Creatinine, Ser 1.68 (*)    Calcium 8.4 (*)    GFR calc non Af Amer 40 (*)    GFR calc Af Amer 47 (*)    All other components within normal limits  URINALYSIS, ROUTINE W REFLEX MICROSCOPIC (NOT AT [facility]) - Abnormal; Notable for the following:    APPearance HAZY (*)    Hgb urine dipstick SMALL (*)    Ketones, ur TRACE (*)    Protein, ur 30 (*)    All other components within normal limits  BASIC METABOLIC PANEL - Abnormal; Notable for the following:    Creatinine, Ser 1.57 (*)    Calcium 7.4 (*)    GFR calc non Af Amer 44 (*)    GFR calc Af Amer 51 (*)    All other components within normal limits  URINE MICROSCOPIC-ADD ON - Abnormal; Notable for the following:    Squamous Epithelial / LPF 0-5 (*)    Bacteria, UA FEW (*)    Casts GRANULAR CAST (*)    All other components within normal limits  C DIFFICILE QUICK SCREEN W PCR REFLEX    Imaging Review Dg Chest 2 View  03/07/2015  CLINICAL DATA:  70 year old with acute onset of shortness of breath. Recent anorexia. Hypoxemia. Current smoker. Prior CABG in 2009. EXAM: CHEST  2 VIEW COMPARISON:  07/15/2014 and earlier. FINDINGS: Sternotomy for CABG. Cardiac silhouette upper normal in size, unchanged. Thoracic aorta tortuous and atherosclerotic, unchanged. Right hilar prominence, likely due to the fact the patient is slightly rotated to the left. Prominent bronchovascular markings diffusely and moderate central peribronchial thickening, more so than on prior examinations. No confluent airspace consolidation. Normal pulmonary vascularity. No pleural effusions. Scarring in the left lower lobe, unchanged. Degenerative changes involving the thoracic spine. IMPRESSION: 1. Moderate changes of acute bronchitis and/or asthma  without focal airspace pneumonia. 2. Stable linear scarring in the left lower lobe. 3. Apparent right hilar prominence is likely due to the fact the patient is slightly rotated to the right. Follow-up chest x-ray after treatment of the acute illness is recommended for confirmation. Electronically Signed   By: Hulan Saas M.D.   On: 03/07/2015 14:07   I have personally reviewed and evaluated these images and lab results as part of my medical decision-making.   EKG Interpretation None  MDM   Final diagnoses:  Dehydration  Diarrhea, unspecified type  COPD with exacerbation (HCC)   Patient given IV fluids 2 L. Does have improved. Has slight leukocyte cytosis 11.3. A 1.68 above his baseline of 1.3-1.4. Recheck 1.57 after 2 L fluid. Saturations 80% in the bed upon arrival. With rest. 91-92. He relates to the bathroom and has desaturation was placed on O2 by nursing. Reexam he has some prolongation and wheezing. Clinically does not just started. Chest x-ray shows COPD changes but no infiltrate or effusions.  Given nebulized albuterol. Urine does not appear infected. He described episode of back pain is feels like he is "losing control" of his urine. He is not incontinent. He states he has urgency and some hesitancy. Has no signs of neurological compromise with normal exam strength and sensation to his legs and a normal gait. He has normal perineal sensation. Plan is albuterol neb and reevaluation. He may be appropriate for home treatment with simple Lomotil, rest, push fluids, stay hydrated. Treatment for COPD with a beer all prednisone) care follow-up. We'll reevaluate after nebulized albuterol treatment.    Rolland Porter, MD 03/07/15 1444

## 2015-03-07 NOTE — Discharge Instructions (Signed)
Chronic Obstructive Pulmonary Disease Exacerbation Chronic obstructive pulmonary disease (COPD) is a common lung condition in which airflow from the lungs is limited. COPD is a general term that can be used to describe many different lung problems that limit airflow, including chronic bronchitis and emphysema. COPD exacerbations are episodes when breathing symptoms become much worse and require extra treatment. Without treatment, COPD exacerbations can be life threatening, and frequent COPD exacerbations can cause further damage to your lungs. CAUSES  Respiratory infections.  Exposure to smoke.  Exposure to air pollution, chemical fumes, or dust. Sometimes there is no apparent cause or trigger. RISK FACTORS  Smoking cigarettes.  Older age.  Frequent prior COPD exacerbations. SIGNS AND SYMPTOMS  Increased coughing.  Increased thick spit (sputum) production.  Increased wheezing.  Increased shortness of breath.  Rapid breathing.  Chest tightness. DIAGNOSIS Your medical history, a physical exam, and tests will help your health care provider make a diagnosis. Tests may include:  A chest X-ray.  Basic lab tests.  Sputum testing.  An arterial blood gas test. TREATMENT Depending on the severity of your COPD exacerbation, you may need to be admitted to a hospital for treatment. Some of the treatments commonly used to treat COPD exacerbations are:   Antibiotic medicines.  Bronchodilators. These are drugs that expand the air passages. They may be given with an inhaler or nebulizer. Spacer devices may be needed to help improve drug delivery.  Corticosteroid medicines.  Supplemental oxygen therapy.  Airway clearing techniques, such as noninvasive ventilation (NIV) and positive expiratory pressure (PEP). These provide respiratory support through a mask or other noninvasive device. HOME CARE INSTRUCTIONS  Do not smoke. Quitting smoking is very important to prevent COPD from  getting worse and exacerbations from happening as often.  Avoid exposure to all substances that irritate the airway, especially to tobacco smoke.  If you were prescribed an antibiotic medicine, finish it all even if you start to feel better.  Take all medicines as directed by your health care provider.It is important to use correct technique with inhaled medicines.  Drink enough fluids to keep your urine clear or pale yellow (unless you have a medical condition that requires fluid restriction).  Use a cool mist vaporizer. This makes it easier to clear your chest when you cough.  If you have a home nebulizer and oxygen, continue to use them as directed.  Maintain all necessary vaccinations to prevent infections.  Exercise regularly.  Eat a healthy diet.  Keep all follow-up appointments as directed by your health care provider. SEEK IMMEDIATE MEDICAL CARE IF:  You have worsening shortness of breath.  You have trouble talking.  You have severe chest pain.  You have blood in your sputum.  You have a fever.  You have weakness, vomit repeatedly, or faint.  You feel confused.  You continue to get worse. MAKE SURE YOU:  Understand these instructions.  Will watch your condition.  Will get help right away if you are not doing well or get worse.   This information is not intended to replace advice given to you by your health care provider. Make sure you discuss any questions you have with your health care provider.   Document Released: 11/07/2006 Document Revised: 01/31/2014 Document Reviewed: 09/14/2012 Elsevier Interactive Patient Education 2016 Elsevier Inc[employer]   Dehydration, Adult Dehydration is a condition in which you do not have enough fluid or water in your body. It happens when you take in less fluid than you lose. Vital organs such  as the kidneys, brain, and heart cannot function without a proper amount of fluids. Any loss of fluids from the body can cause  dehydration.  Dehydration can range from mild to severe. This condition should be treated right away to help prevent it from becoming severe. CAUSES  This condition may be caused by:  Vomiting.  Diarrhea.  Excessive sweating, such as when exercising in hot or humid weather.  Not drinking enough fluid during strenuous exercise or during an illness.  Excessive urine output.  Fever.  Certain medicines. RISK FACTORS This condition is more likely to develop in:  People who are taking certain medicines that cause the body to lose excess fluid (diuretics).   People who have a chronic illness, such as diabetes, that may increase urination.  Older adults.   People who live at high altitudes.   People who participate in endurance sports.  SYMPTOMS  Mild Dehydration  Thirst.  Dry lips.  Slightly dry mouth.  Dry, warm skin. Moderate Dehydration  Very dry mouth.   Muscle cramps.   Dark urine and decreased urine production.   Decreased tear production.   Headache.   Light-headedness, especially when you stand up from a sitting position.  Severe Dehydration  Changes in skin.   Cold and clammy skin.   Skin does not spring back quickly when lightly pinched and released.   Changes in body fluids.   Extreme thirst.   No tears.   Not able to sweat when body temperature is high, such as in hot weather.   Minimal urine production.   Changes in vital signs.   Rapid, weak pulse (more than 100 beats per minute when you are sitting still).   Rapid breathing.   Low blood pressure.   Other changes.   Sunken eyes.   Cold hands and feet.   Confusion.  Lethargy and difficulty being awakened.  Fainting (syncope).   Short-term weight loss.   Unconsciousness. DIAGNOSIS  This condition may be diagnosed based on your symptoms. You may also have tests to determine how severe your dehydration is. These tests may include:   Urine  tests.   Blood tests.  TREATMENT  Treatment for this condition depends on the severity. Mild or moderate dehydration can often be treated at home. Treatment should be started right away. Do not wait until dehydration becomes severe. Severe dehydration needs to be treated at the hospital. Treatment for Mild Dehydration  Drinking plenty of water to replace the fluid you have lost.   Replacing minerals in your blood (electrolytes) that you may have lost.  Treatment for Moderate Dehydration  Consuming oral rehydration solution (ORS). Treatment for Severe Dehydration  Receiving fluid through an IV tube.   Receiving electrolyte solution through a feeding tube that is passed through your nose and into your stomach (nasogastric tube or NG tube).  Correcting any abnormalities in electrolytes. HOME CARE INSTRUCTIONS   Drink enough fluid to keep your urine clear or pale yellow.   Drink water or fluid slowly by taking small sips. You can also try sucking on ice cubes.  Have food or beverages that contain electrolytes. Examples include bananas and sports drinks.  Take over-the-counter and prescription medicines only as told by your health care provider.   Prepare ORS according to the manufacturer's instructions. Take sips of ORS every 5 minutes until your urine returns to normal.  If you have vomiting or diarrhea, continue to try to drink water, ORS, or both.   If you have  diarrhea, avoid:   Beverages that contain caffeine.   Fruit juice.   Milk.   Carbonated soft drinks.  Do not take salt tablets. This can lead to the condition of having too much sodium in your body (hypernatremia).  SEEK MEDICAL CARE IF:  You cannot eat or drink without vomiting.  You have had moderate diarrhea during a period of more than 24 hours.  You have a fever. SEEK IMMEDIATE MEDICAL CARE IF:   You have extreme thirst.  You have severe diarrhea.  You have not urinated in 6-8  hours, or you have urinated only a small amount of very dark urine.  You have shriveled skin.  You are dizzy, confused, or both.   This information is not intended to replace advice given to you by your health care provider. Make sure you discuss any questions you have with your health care provider.   Document Released: 01/10/2005 Document Revised: 10/01/2014 Document Reviewed: 05/28/2014 Elsevier Interactive Patient Education 2016 Else[employer] Inc.  Diarrhea Diarrhea is watery poop (stool). It can make you feel weak, tired, thirsty, or give you a dry mouth (signs of dehydration). Watery poop is a sign of another problem, most often an infection. It often lasts 2-3 days. It can last longer if it is a sign of something serious. Take care of yourself as told by your doctor. HOME CARE   Drink 1 cup (8 ounces) of fluid each time you have watery poop.  Do not drink the following fluids:  Those that contain simple sugars (fructose, glucose, galactose, lactose, sucrose, maltose).  Sports drinks.  Fruit juices.  Whole milk products.  Sodas.  Drinks with caffeine (coffee, tea, soda) or alcohol.  Oral rehydration solution may be used if the doctor says it is okay. You may make your own solution. Follow this recipe:   - teaspoon table salt.   teaspoon baking soda.   teaspoon salt substitute containing potassium chloride.  1 tablespoons sugar.  1 liter (34 ounces) of water.  Avoid the following foods:  High fiber foods, such as raw fruits and vegetables.  Nuts, seeds, and whole grain breads and cereals.   Those that are sweetened with sugar alcohols (xylitol, sorbitol, mannitol).  Try eating the following foods:  Starchy foods, such as rice, toast, pasta, low-sugar cereal, oatmeal, baked potatoes, crackers, and bagels.  Bananas.  Applesauce.  Eat probiotic-rich foods, such as yogurt and milk products that are fermented.  Wash your hands well after each time you have  watery poop.  Only take medicine as told by your doctor.  Take a warm bath to help lessen burning or pain from having watery poop. GET HELP RIGHT AWAY IF:   You cannot drink fluids without throwing up (vomiting).  You keep throwing up.  You have blood in your poop, or your poop looks black and tarry.  You do not pee (urinate) in 6-8 hours, or there is only a small amount of very dark pee.  You have belly (abdominal) pain that gets worse or stays in the same spot (localizes).  You are weak, dizzy, confused, or light-headed.  You have a very bad headache.  Your watery poop gets worse or does not get better.  You have a fever or lasting symptoms for more than 2-3 days.  You have a fever and your symptoms suddenly get worse. MAKE SURE YOU:   Understand these instructions.  Will watch your condition.  Will get help right away if you are not doing well  or get worse.   This information is not intended to replace advice given to you by your health care provider. Make sure you discuss any questions you have with your health care provider.   Document Released: 06/29/2007 Document Revised: 01/31/2014 Document Reviewed: 09/18/2011 Elsevier Interactive Patient Education 2016 Elsevier Inc.  Document Released: 01/10/2005 Document Revised: 10/01/2014 Document Reviewed: 01/24/2014 Elsevier Interactive Patient Education [employer]

## 2015-03-16 DIAGNOSIS — Z0000 Encounter for general adult medical examination without abnormal findings: Secondary | ICD-10-CM | POA: Diagnosis not present

## 2015-03-16 DIAGNOSIS — Z6826 Body mass index (BMI) 26.0-26.9, adult: Secondary | ICD-10-CM | POA: Diagnosis not present

## 2015-03-16 DIAGNOSIS — Z1389 Encounter for screening for other disorder: Secondary | ICD-10-CM | POA: Diagnosis not present

## 2015-03-25 ENCOUNTER — Other Ambulatory Visit: Payer: Self-pay | Admitting: Cardiovascular Disease

## 2015-03-25 NOTE — Telephone Encounter (Signed)
Rx(s) sent to pharmacy electronically.  

## 2015-06-26 DIAGNOSIS — Z6826 Body mass index (BMI) 26.0-26.9, adult: Secondary | ICD-10-CM | POA: Diagnosis not present

## 2015-06-26 DIAGNOSIS — Z23 Encounter for immunization: Secondary | ICD-10-CM | POA: Diagnosis not present

## 2015-06-26 DIAGNOSIS — M545 Low back pain: Secondary | ICD-10-CM | POA: Diagnosis not present

## 2015-06-26 DIAGNOSIS — Z1389 Encounter for screening for other disorder: Secondary | ICD-10-CM | POA: Diagnosis not present

## 2015-07-14 ENCOUNTER — Other Ambulatory Visit: Payer: Self-pay | Admitting: *Deleted

## 2015-07-14 ENCOUNTER — Other Ambulatory Visit: Payer: Self-pay | Admitting: Cardiovascular Disease

## 2015-07-14 MED ORDER — RANOLAZINE ER 1000 MG PO TB12: 1000.0000 mg | ORAL_TABLET | Freq: Two times a day (BID) | ORAL | Status: DC

## 2015-08-01 ENCOUNTER — Other Ambulatory Visit: Payer: Self-pay | Admitting: Cardiovascular Disease

## 2015-09-16 ENCOUNTER — Encounter: Payer: Self-pay | Admitting: Surgery

## 2015-09-21 ENCOUNTER — Ambulatory Visit: Payer: Commercial Managed Care - HMO | Admitting: Surgery

## 2015-09-21 ENCOUNTER — Encounter (HOSPITAL_COMMUNITY): Payer: Commercial Managed Care - HMO

## 2015-10-08 ENCOUNTER — Other Ambulatory Visit: Payer: Self-pay | Admitting: Cardiovascular Disease

## 2015-11-02 ENCOUNTER — Ambulatory Visit: Payer: Commercial Managed Care - HMO | Admitting: Surgery

## 2015-11-02 ENCOUNTER — Encounter (HOSPITAL_COMMUNITY): Payer: Commercial Managed Care - HMO

## 2015-11-11 ENCOUNTER — Other Ambulatory Visit: Payer: Self-pay | Admitting: Cardiovascular Disease

## 2015-11-12 NOTE — Telephone Encounter (Signed)
REFILL 

## 2015-11-16 ENCOUNTER — Encounter (HOSPITAL_COMMUNITY): Payer: Commercial Managed Care - HMO

## 2015-11-16 ENCOUNTER — Ambulatory Visit: Payer: Commercial Managed Care - HMO | Admitting: Surgery

## 2015-11-25 ENCOUNTER — Encounter: Payer: Self-pay | Admitting: Surgery

## 2015-11-28 ENCOUNTER — Other Ambulatory Visit: Payer: Self-pay | Admitting: Cardiovascular Disease

## 2015-11-30 ENCOUNTER — Ambulatory Visit (HOSPITAL_COMMUNITY)
Admission: RE | Admit: 2015-11-30 | Discharge: 2015-11-30 | Disposition: A | Discharge status: Home / Self Care | Payer: Commercial Managed Care - HMO | Source: Ambulatory Visit | Attending: Surgery | Admitting: Surgery

## 2015-11-30 ENCOUNTER — Ambulatory Visit (INDEPENDENT_AMBULATORY_CARE_PROVIDER_SITE_OTHER): Payer: Commercial Managed Care - HMO | Admitting: Surgery

## 2015-11-30 VITALS — BP 90/64 | HR 68 | Temp 98.4°F | Resp 18 | Ht 67.0 in | Wt 173.0 lb

## 2015-11-30 DIAGNOSIS — I6521 Occlusion and stenosis of right carotid artery: Secondary | ICD-10-CM | POA: Diagnosis not present

## 2015-11-30 DIAGNOSIS — I6522 Occlusion and stenosis of left carotid artery: Secondary | ICD-10-CM | POA: Diagnosis not present

## 2015-11-30 LAB — VAS US CAROTID
LCCADDIAS: -22 cm/s
LCCADSYS: -95 cm/s
LEFT ECA DIAS: -11 cm/s
LEFT VERTEBRAL DIAS: -12 cm/s
LICADDIAS: -21 cm/s
LICADSYS: -56 cm/s
LICAPSYS: -93 cm/s
Left CCA prox dias: 24 cm/s
Left CCA prox sys: 118 cm/s
Left ICA prox dias: -31 cm/s
RCCADSYS: -64 cm/s
RIGHT CCA MID DIAS: 15 cm/s
RIGHT ECA DIAS: -9 cm/s
RIGHT VERTEBRAL DIAS: -9 cm/s
Right CCA prox dias: -11 cm/s
Right CCA prox sys: -87 cm/s

## 2015-11-30 NOTE — Progress Notes (Signed)
[facility] [place]  Patient name: [name] MRN: [id number] DOB: 1945/04/29 Sex: male  REASON FOR VISIT: follow up  HPI: [name] is a 70 y.o. male returns today for follow-up.  He is status post right carotid endarterectomy on 12/11/2014.  This was done for asymptomatic right carotid stenosis.  Intraoperative findings included a 90% stenosis.  This was a very difficult repair.  His lesion was surrounded by a dense inflammatory tissue.  The distal extent of the disease was approximately 3 cm beyond the carotid bifurcation.  The lesion also went down below the clavicle.  The common carotid artery was closed primarily and a bovine pericardial patch angioplasty was performed of the distal common carotid and internal carotid artery.  His postoperative course was uncomplicated.  He is here for follow-up.  The patient denies any neurologic symptoms.  He denies numbness or weakness in either extremity.  He denies amaurosis fugax.  He denies slurred speech.  He sensation along his face is returning  Past Medical History:  Diagnosis Date  . Bilateral carotid artery disease (HCC)   . CAD s/p CABG 2009 10/31/2012   Free LIMA to LAD (left subclavian artery occlusion), SVG to PDA, SVG to oblique marginal, SVG to ramus intermedius. Previous stents in the LAD artery and left circumflex coronary artery   . Complication of anesthesia    pt. states that he has a difficult time breathing when waking up from anesthesia  . History of anxiety   . History of depression   . HTN (hypertension) 10/31/2012  . Hyperlipidemia 10/31/2012  . 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 Stroke [facility](HCC)    pt. states that he was told he had a stroke many years ago but states no deficits.   . Urinary urgency     Family History  Problem Relation Age of Onset  . Congestive Heart Failure Mother   . Stroke Father   . Heart  attack Maternal Grandmother   . Heart attack Maternal Grandfather     SOCIAL HISTORY: Social History  Substance Use Topics  . Smoking status: Current Some Day Smoker    Packs/day: 0.25    Types: Cigarettes  . Smokeless tobacco: Not on file  . Alcohol use 2.4 oz/week    4 Cans of beer per week     Comment: daily;     Allergies  Allergen Reactions  . Oxycodone-Acetaminophen     Rash  "facial blotches"     Current Outpatient Prescriptions  Medication Sig Dispense Refill  . aspirin EC 81 MG tablet Take 81 mg by mouth daily.    . carvedilol (COREG) 3.125 MG tablet Take 1 tablet (3.125 mg total) by mouth 2 (two) times daily. 60 tablet 1  . clopidogrel (PLAVIX) 75 MG tablet Take 1 tablet (75 mg total) by mouth daily. KEEP OV. 30 tablet 0  . lisinopril (PRINIVIL,ZESTRIL) 5 MG tablet Take 1 tablet (5 mg total) by mouth daily. Please call and schedule appt for further refills [phone] 30 tablet 2  . nitroGLYCERIN (NITROSTAT) 0.4 MG SL tablet Place 1 tablet (0.4 mg total) under the tongue every 5 (five) minutes as needed for chest pain. 25 tablet 8  . pantoprazole (PROTONIX) 40 MG tablet TAKE ONE (1) TABLET EACH DAY 30 tablet 1  . ranolazine (RANEXA) 1000 MG SR tablet Take 1 tablet (1,000 mg total) by mouth 2 (two) times daily. PLEASE CONTACT OFFICE FOR ADDITIONAL  REFILLS 60 each 1  . simvastatin (ZOCOR) 20 MG tablet Take 1 tablet (20 mg total) by mouth every evening. 30 tablet 8  . albuterol (PROVENTIL HFA;VENTOLIN HFA) 108 (90 Base) MCG/ACT inhaler Inhale 1-2 puffs into the lungs every 6 (six) hours as needed for wheezing. (Patient not taking: Reported on 11/30/2015) 1 Inhaler 0  . isosorbide mononitrate (IMDUR) 30 MG 24 hr tablet TAKE ONE TABLET ONE HOUR BEFORE BEDTIME (Patient not taking: Reported on 11/30/2015) 30 tablet 7   No current facility-administered medications for this visit.     REVIEW OF SYSTEMS:  [X]  denotes positive finding, [ ]  denotes negative finding Cardiac   Comments:  Chest pain or chest pressure:    Shortness of breath upon exertion:    Short of breath when lying flat:    Irregular heart rhythm:        Vascular    Pain in calf, thigh, or hip brought on by ambulation:    Pain in feet at night that wakes you up from your sleep:     Blood clot in your veins:    Leg swelling:         Pulmonary    Oxygen at home:    Productive cough:     Wheezing:         Neurologic    Sudden weakness in arms or legs:     Sudden numbness in arms or legs:     Sudden onset of difficulty speaking or slurred speech:    Temporary loss of vision in one eye:     Problems with dizziness:  x Standing up too quickly      Gastrointestinal    Blood in stool:     Vomited blood:         Genitourinary    Burning when urinating:     Blood in urine:        Psychiatric    Major depression:         Hematologic    Bleeding problems:    Problems with blood clotting too easily:        Skin    Rashes or ulcers:        Constitutional    Fever or chills:      PHYSICAL EXAM: Vitals:   11/30/15 1128 11/30/15 1129  BP: 126/60 90/64  Pulse: 64 68  Resp:  18  Temp:  98.4 F (36.9 C)  TempSrc:  Oral  SpO2:  93%  Weight:  173 lb (78.5 kg)  Height:  5\' 7"  (1.702 m)    GENERAL: The patient is a well-nourished male, in no acute distress. The vital signs are documented above. CARDIAC: There is a regular rate and rhythm.  VASCULAR: no carotid bruit PULMONARY: There is good air exchange bilaterally  MUSCULOSKELETAL: There are no major deformities or cyanosis. NEUROLOGIC: No focal weakness or paresthesias are detected. SKIN: There are no ulcers or rashes noted. PSYCHIATRIC: The patient has a normal affect.  DATA:  I have ordered and reviewed his carotid ultrasound.  This shows a patent right carotid endarterectomy site with no right internal carotid artery stenosis.  The left side shows 1-39 percent  MEDICAL ISSUES: Status post right carotid  endarterectomy, the patient was placed on surveillance protocol and follow up in one year    Durene CalWells Rufina Kimery, MD [facility] Tel [phone] Pager [phone]

## 2015-11-30 NOTE — Telephone Encounter (Signed)
Rx request sent to pharmacy.  

## 2015-12-14 DIAGNOSIS — Z719 Counseling, unspecified: Secondary | ICD-10-CM | POA: Diagnosis not present

## 2015-12-14 DIAGNOSIS — E663 Overweight: Secondary | ICD-10-CM | POA: Diagnosis not present

## 2015-12-14 DIAGNOSIS — Z1389 Encounter for screening for other disorder: Secondary | ICD-10-CM | POA: Diagnosis not present

## 2015-12-14 DIAGNOSIS — Z6826 Body mass index (BMI) 26.0-26.9, adult: Secondary | ICD-10-CM | POA: Diagnosis not present

## 2015-12-14 DIAGNOSIS — J209 Acute bronchitis, unspecified: Secondary | ICD-10-CM | POA: Diagnosis not present

## 2015-12-14 DIAGNOSIS — J069 Acute upper respiratory infection, unspecified: Secondary | ICD-10-CM | POA: Diagnosis not present

## 2015-12-15 ENCOUNTER — Other Ambulatory Visit: Payer: Self-pay | Admitting: Cardiovascular Disease

## 2015-12-16 NOTE — Addendum Note (Signed)
Addended by: Burton ApleyPETTY, Damya Comley A on: 12/16/2015 08:56 AM   Modules accepted: Orders

## 2015-12-26 ENCOUNTER — Other Ambulatory Visit: Payer: Self-pay | Admitting: Cardiovascular Disease

## 2015-12-28 NOTE — Telephone Encounter (Signed)
Rx(s) sent to pharmacy electronically.  

## 2015-12-31 ENCOUNTER — Other Ambulatory Visit: Payer: Self-pay | Admitting: Cardiovascular Disease

## 2016-01-02 ENCOUNTER — Other Ambulatory Visit: Payer: Self-pay | Admitting: Cardiovascular Disease

## 2016-01-04 NOTE — Telephone Encounter (Signed)
Rx(s) sent to pharmacy electronically.  

## 2016-01-16 ENCOUNTER — Other Ambulatory Visit: Payer: Self-pay | Admitting: Cardiovascular Disease

## 2016-01-19 NOTE — Telephone Encounter (Signed)
REFILL 

## 2016-02-02 ENCOUNTER — Ambulatory Visit: Payer: Commercial Managed Care - HMO | Admitting: Cardiovascular Disease

## 2016-02-04 ENCOUNTER — Encounter: Payer: Self-pay | Admitting: Cardiovascular Disease

## 2016-02-04 ENCOUNTER — Ambulatory Visit (INDEPENDENT_AMBULATORY_CARE_PROVIDER_SITE_OTHER): Payer: Medicare HMO | Admitting: Cardiovascular Disease

## 2016-02-04 VITALS — BP 108/64 | HR 75 | Ht 68.0 in | Wt 170.0 lb

## 2016-02-04 DIAGNOSIS — I25118 Atherosclerotic heart disease of native coronary artery with other forms of angina pectoris: Secondary | ICD-10-CM | POA: Diagnosis not present

## 2016-02-04 DIAGNOSIS — Z720 Tobacco use: Secondary | ICD-10-CM | POA: Diagnosis not present

## 2016-02-04 DIAGNOSIS — I739 Peripheral vascular disease, unspecified: Secondary | ICD-10-CM

## 2016-02-04 DIAGNOSIS — R9431 Abnormal electrocardiogram [ECG] [EKG]: Secondary | ICD-10-CM

## 2016-02-04 DIAGNOSIS — E7800 Pure hypercholesterolemia, unspecified: Secondary | ICD-10-CM | POA: Diagnosis not present

## 2016-02-04 MED ORDER — SIMVASTATIN 20 MG PO TABS: 20.0000 mg | ORAL_TABLET | Freq: Every evening | ORAL | 3 refills | Status: DC

## 2016-02-04 MED ORDER — LISINOPRIL 5 MG PO TABS: 5.0000 mg | ORAL_TABLET | Freq: Every day | ORAL | 3 refills | Status: DC

## 2016-02-04 MED ORDER — CARVEDILOL 3.125 MG PO TABS: 3.1250 mg | ORAL_TABLET | Freq: Two times a day (BID) | ORAL | 3 refills | Status: DC

## 2016-02-04 MED ORDER — ISOSORBIDE MONONITRATE ER 30 MG PO TB24: 30.0000 mg | ORAL_TABLET | Freq: Every day | ORAL | 3 refills | Status: AC

## 2016-02-04 MED ORDER — CLOPIDOGREL BISULFATE 75 MG PO TABS: 75.0000 mg | ORAL_TABLET | Freq: Every day | ORAL | 3 refills | Status: DC

## 2016-02-04 NOTE — Patient Instructions (Signed)
Dr Royann Shiversroitoru has recommended making the following medication changes: 1. RESTART Isosorbide 30 mg - take 1 tablet daily at bedtime  Your physician recommends that you schedule a follow-up appointment in 1 year. You will receive a reminder letter in the mail two months in advance. If you don't receive a letter, please call our office to schedule the follow-up appointment.  If you need a refill on your cardiac medications before your next appointment, please call your pharmacy.

## 2016-02-04 NOTE — Progress Notes (Signed)
Cardiology Office Note    Date:  02/04/2016   ID:  [name], DOB 13-Jul-1945, MRN [id number]  PCP:  Cassell SmilesFUSCO,LAWRENCE J., MD  Cardiologist:   Thurmon FairMihai Terree Gaultney, MD   Chief Complaint  Patient presents with  . Follow-up    History of Present Illness:  [name] is a 71 y.o. male with long-standing coronary artery disease, stents to LAD and LCX and bypass surgery in 2009 (SVG to PDA, SVG to OM, free LIMA to LAD; SVG to ramus occluded at repeat cath 2013), PAD (known occlusion of the left subclavian artery, history of right carotid endarterectomy), hypertension, hyperlipidemia, returning for routine follow-up.  He is known to have diffuse distal disease in the territory of all 3 major coronary arteries, not amenable to revascularization and is therefore receiving medical therapy. He has recently run out of Ranexa which she was getting primarily as samples. I'm not sure why, but he is no longer taking isosorbide mononitrate at bedtime. The use of effective doses of other antianginals has always been limited by hypotension.  He complains of infrequent problems with angina pectoris. His sometimes this happens when he lies down in bed at night in a pattern suggestive of angina decubitus. If he sits up for 5 minutes the symptoms resolve and then he is able to lie down again without a problem. He also has angina pectoris if he performs very strenuous physical activity during the daytime.  Despite these complaints he is generally feeling quite well. He feels that he can perform all his activities without restrictions. He denies edema, claudication, focal neurological events, palpitations or syncope. (He had a syncopal event in the summer of 2015, probably due to dehydration).  Past Medical History:  Diagnosis Date  . Bilateral carotid artery disease (HCC)   . CAD s/p CABG 2009 10/31/2012   Free LIMA to LAD (left subclavian artery occlusion), SVG to PDA, SVG to oblique marginal, SVG to ramus  intermedius. Previous stents in the LAD artery and left circumflex coronary artery   . Complication of anesthesia    pt. states that he has a difficult time breathing when waking up from anesthesia  . History of anxiety   . History of depression   . HTN (hypertension) 10/31/2012  . Hyperlipidemia 10/31/2012  . 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 Stroke [facility](HCC)    pt. states that he was told he had a stroke many years ago but states no deficits.   . Urinary urgency     Past Surgical History:  Procedure Laterality Date  . CHOLECYSTECTOMY    . COLONOSCOPY    . CORONARY ARTERY BYPASS GRAFT  2009  . ENDARTERECTOMY Right 12/11/2014   Procedure: RIGHT CAROTID ENDARTERECTOMY WITH BOVINE PERICARDIAL PATCH ANGIOPLASTY;  Surgeon: Nada LibmanVance W Brabham, MD;  Location: [facility] OR;  Service: Vascular;  Laterality: Right;  . LEFT HEART CATHETERIZATION WITH CORONARY/GRAFT ANGIOGRAM N/A 06/08/2011   Procedure: LEFT HEART CATHETERIZATION WITH Isabel CapriceORONARY/GRAFT ANGIOGRAM;  Surgeon: Thurmon FairMihai Jamila Slatten, MD;  Location: [facility] CATH LAB;  Service: Cardiovascular;  Laterality: N/A;  . ROTATOR CUFF REPAIR Bilateral     Current Medications: Outpatient Medications Prior to Visit  Medication Sig Dispense Refill  . aspirin EC 81 MG tablet Take 81 mg by mouth daily.    . nitroGLYCERIN (NITROSTAT) 0.4 MG SL tablet Place 1 tablet (0.4 mg total) under the tongue every 5 (five) minutes as needed for chest pain. 25 tablet 8  . pantoprazole (PROTONIX)  40 MG tablet TAKE ONE (1) TABLET BY MOUTH EVERY DAY 30 tablet 0  . RANEXA 1000 MG SR tablet TAKE ONE TABLET BY MOUTH TWICE A DAY 60 each 11  . carvedilol (COREG) 3.125 MG tablet TAKE ONE TABLET BY MOUTH TWICE A DAY 30 tablet 0  . clopidogrel (PLAVIX) 75 MG tablet TAKE ONE (1) TABLET EACH DAY MUST KEEP APPT 02/02/2016 FOR FUTURE REFILLS 30 tablet 0  . lisinopril (PRINIVIL,ZESTRIL) 5 MG tablet TAKE ONE (1) TABLET BY MOUTH EVERY DAY 30 tablet 3  . simvastatin  (ZOCOR) 20 MG tablet Take 1 tablet (20 mg total) by mouth every evening. 30 tablet 8  . albuterol (PROVENTIL HFA;VENTOLIN HFA) 108 (90 Base) MCG/ACT inhaler Inhale 1-2 puffs into the lungs every 6 (six) hours as needed for wheezing. (Patient not taking: Reported on 11/30/2015) 1 Inhaler 0  . isosorbide mononitrate (IMDUR) 30 MG 24 hr tablet TAKE ONE TABLET ONE HOUR BEFORE BEDTIME (Patient not taking: Reported on 02/04/2016) 30 tablet 7   No facility-administered medications prior to visit.      Allergies:   Oxycodone-acetaminophen   Social History   Social History  . Marital status: Legally Separated    Spouse name: N/A  . Number of children: N/A  . Years of education: N/A   Social History Main Topics  . Smoking status: Current Some Day Smoker    Packs/day: 0.25    Types: Cigarettes  . Smokeless tobacco: Not on file  . Alcohol use 2.4 oz/week    4 Cans of beer per week     Comment: daily;   . Drug use: No  . Sexual activity: Not on file   Other Topics Concern  . Not on file   Social History Narrative  . No narrative on file     Family History:  The patient's family history includes Congestive Heart Failure in his mother; Heart attack in his maternal grandfather and maternal grandmother; Stroke in his father.   ROS:   Please see the history of present illness.    ROS All other systems reviewed and are negative.   PHYSICAL EXAM:   VS:  BP 108/64   Pulse 75   Ht 5\' 8"  (1.727 m)   Wt 77.1 kg (170 lb)   BMI 25.85 kg/m    GEN: Well nourished, well developed, in no acute distress  HEENT: normal  Neck: no JVD, Bilateral carotid bruits, well-healed right endarterectomy scar, no goiter or masses Cardiac: RRR; no murmurs, rubs, or gallops,no edema . Much weaker pulse in left radial/left ulnar compared to the right. Respiratory:  clear to auscultation bilaterally, normal work of breathing GI: soft, nontender, nondistended, + BS MS: no deformity or atrophy  Skin: warm and  dry, no rash Neuro:  Alert and Oriented x 3, Strength and sensation are intact Psych: euthymic mood, full affect  Wt Readings from Last 3 Encounters:  02/04/16 77.1 kg (170 lb)  11/30/15 78.5 kg (173 lb)  03/07/15 76.7 kg (169 lb)      Studies/Labs Reviewed:   EKG:  EKG is  ordered today.  The ekg ordered today demonstrates Normal sinus rhythm with a single PVC, pre-existing intraventricular conduction delay (QRS 134 ms), ST depression seen in leads 1, 2, aVL, V4-V6 (similar to previous tracings), QTC 466 ms  Recent Labs: 03/07/2015: BUN 20; Creatinine, Ser 1.57; Hemoglobin 15.2; Platelets 166; Potassium 3.9; Sodium 136   Lipid Panel    Component Value Date/Time   CHOL 186 12/25/2012 0852  TRIG 206 (H) 12/25/2012 0852   HDL 47 12/25/2012 0852   CHOLHDL 4.0 12/25/2012 0852   VLDL 41 (H) 12/25/2012 0852   LDLCALC 98 12/25/2012 0852     ASSESSMENT:    1. Coronary artery disease of native artery of native heart with stable angina pectoris (HCC)   2. 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 Pure hypercholesterolemia   4. Tobacco abuse   5. Prolonged Q-T interval on ECG      PLAN:  In order of problems listed above:  1. CAD: I think we will be able to alleviate his symptoms of angina decubitus if he starts taking his bedtime isosorbide mononitrate again. Hopefully we will be able to get Ranexa for him from the pharmaceutical patient assistance program. This medication has worked really well for him. Are other antianginal options are very limited since his blood pressure tends to run low. No good options for percutaneous revascularization. 2. PAD: Known chronic occlusion of the left subclavian artery, blood pressure should always be checked only on the right side. Carotid ultrasound followed by Dr. Myra Gianotti. No evidence of significant stenoses on either side. 3. HLP: Get updated labs. Target LDL-C under 70 mg/dL. Note possible interaction between Ranexa  and simvastatin. If this dose of statin is insufficient will have to switch to an alternative agent such as rosuvastatin. 4. Smoking: Encouraged him to keep trying to quit smoking 5. Long QT: Is is very mild and stable compared to last year. It is primarily attributable to his intraventricular conduction delay, not to Ranexa.    Medication Adjustments/Labs and Tests Ordered: Current medicines are reviewed at length with the patient today.  Concerns regarding medicines are outlined above.  Medication changes, Labs and Tests ordered today are listed in the Patient Instructions below. Patient Instructions  Dr Royann Shivers has recommended making the following medication changes: 1. RESTART Isosorbide 30 mg - take 1 tablet daily at bedtime  Your physician recommends that you schedule a follow-up appointment in 1 year. You will receive a reminder letter in the mail two months in advance. If you don't receive a letter, please call our office to schedule the follow-up appointment.  If you need a refill on your cardiac medications before your next appointment, please call your pharmacy.    Signed, Thurmon Fair, MD  02/04/2016 3:18 PM    [facility] [address] [place], [place], [place]  [address] Phone: [phone]; Fax: [phone]

## 2016-02-22 ENCOUNTER — Other Ambulatory Visit: Payer: Self-pay | Admitting: Cardiovascular Disease

## 2016-03-16 DIAGNOSIS — Z6827 Body mass index (BMI) 27.0-27.9, adult: Secondary | ICD-10-CM | POA: Diagnosis not present

## 2016-03-16 DIAGNOSIS — Z0000 Encounter for general adult medical examination without abnormal findings: Secondary | ICD-10-CM | POA: Diagnosis not present

## 2016-03-16 DIAGNOSIS — I10 Essential (primary) hypertension: Secondary | ICD-10-CM | POA: Diagnosis not present

## 2016-03-16 DIAGNOSIS — I2510 Atherosclerotic heart disease of native coronary artery without angina pectoris: Secondary | ICD-10-CM | POA: Diagnosis not present

## 2016-03-16 DIAGNOSIS — Z1389 Encounter for screening for other disorder: Secondary | ICD-10-CM | POA: Diagnosis not present

## 2016-03-16 DIAGNOSIS — E782 Mixed hyperlipidemia: Secondary | ICD-10-CM | POA: Diagnosis not present

## 2016-04-10 IMAGING — NM NM MYOCAR MULTI W/ SPECT
3 series · 18 of 18 positions shown · non-contrast
Comparison: none

[Series 1: rest_(id)_sa · 6.5mm · 6.51mm/px · 6 of 64 frames shown]
[frame 6/64]
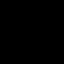
[frame 16/64]
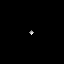
[frame 27/64]
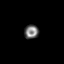
[frame 38/64]
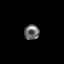
[frame 48/64]
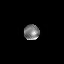
[frame 59/64]
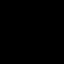

[Series 1: stress_(id)_sa · 6.5mm · 6.51mm/px · 6 of 512 frames shown (1 of 2)]
[frame 43/512]
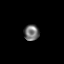
[frame 128/512]
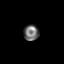
[frame 214/512]
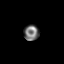
[frame 299/512]
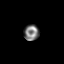
[frame 384/512]
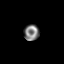
[frame 470/512]
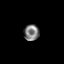

[Series 1: stress_(id)_sa · 6.5mm · 6.51mm/px · 6 of 64 frames shown (2 of 2)]
[frame 6/64]
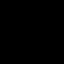
[frame 16/64]
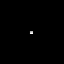
[frame 27/64]
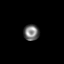
[frame 38/64]
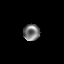
[frame 48/64]
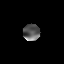
[frame 59/64]
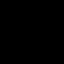

[18 of 18 positions shown; findings below may reference images not displayed]

Canned report from images found in remote index.

Refer to host system for actual result text.

## 2016-04-19 ENCOUNTER — Other Ambulatory Visit: Payer: Self-pay | Admitting: Cardiovascular Disease

## 2016-04-19 NOTE — Telephone Encounter (Signed)
Rx has been sent to the pharmacy electronically. ° °

## 2016-05-11 ENCOUNTER — Encounter: Payer: Self-pay | Admitting: Cardiology

## 2016-07-01 DIAGNOSIS — Z6826 Body mass index (BMI) 26.0-26.9, adult: Secondary | ICD-10-CM | POA: Diagnosis not present

## 2016-07-01 DIAGNOSIS — R42 Dizziness and giddiness: Secondary | ICD-10-CM | POA: Diagnosis not present

## 2016-07-01 DIAGNOSIS — E663 Overweight: Secondary | ICD-10-CM | POA: Diagnosis not present

## 2016-07-01 DIAGNOSIS — I6523 Occlusion and stenosis of bilateral carotid arteries: Secondary | ICD-10-CM | POA: Diagnosis not present

## 2016-07-15 ENCOUNTER — Other Ambulatory Visit (HOSPITAL_COMMUNITY): Payer: Self-pay | Admitting: Internal Medicine

## 2016-07-15 DIAGNOSIS — R42 Dizziness and giddiness: Secondary | ICD-10-CM

## 2016-08-09 ENCOUNTER — Ambulatory Visit (HOSPITAL_COMMUNITY)
Admission: RE | Admit: 2016-08-09 | Discharge: 2016-08-09 | Disposition: A | Discharge status: Home / Self Care | Payer: Medicare HMO | Source: Ambulatory Visit | Attending: Internal Medicine | Admitting: Internal Medicine

## 2016-08-09 DIAGNOSIS — J320 Chronic maxillary sinusitis: Secondary | ICD-10-CM | POA: Insufficient documentation

## 2016-08-09 DIAGNOSIS — I7090 Unspecified atherosclerosis: Secondary | ICD-10-CM | POA: Insufficient documentation

## 2016-08-09 DIAGNOSIS — R42 Dizziness and giddiness: Secondary | ICD-10-CM | POA: Diagnosis not present

## 2016-08-09 DIAGNOSIS — I639 Cerebral infarction, unspecified: Secondary | ICD-10-CM | POA: Diagnosis not present

## 2016-09-23 ENCOUNTER — Other Ambulatory Visit: Payer: Self-pay | Admitting: *Deleted

## 2016-09-23 MED ORDER — PANTOPRAZOLE SODIUM 40 MG PO TBEC: 40.0000 mg | DELAYED_RELEASE_TABLET | Freq: Every day | ORAL | 3 refills | Status: DC

## 2016-10-31 DIAGNOSIS — Z6827 Body mass index (BMI) 27.0-27.9, adult: Secondary | ICD-10-CM | POA: Diagnosis not present

## 2016-10-31 DIAGNOSIS — M7989 Other specified soft tissue disorders: Secondary | ICD-10-CM | POA: Diagnosis not present

## 2016-10-31 DIAGNOSIS — Z1389 Encounter for screening for other disorder: Secondary | ICD-10-CM | POA: Diagnosis not present

## 2016-10-31 DIAGNOSIS — E663 Overweight: Secondary | ICD-10-CM | POA: Diagnosis not present

## 2016-12-05 ENCOUNTER — Encounter (HOSPITAL_COMMUNITY): Payer: Commercial Managed Care - HMO

## 2016-12-05 ENCOUNTER — Ambulatory Visit: Payer: Commercial Managed Care - HMO | Admitting: Surgery

## 2016-12-12 ENCOUNTER — Encounter: Payer: Self-pay | Admitting: Surgery

## 2016-12-12 ENCOUNTER — Ambulatory Visit (HOSPITAL_COMMUNITY)
Admission: RE | Admit: 2016-12-12 | Discharge: 2016-12-12 | Disposition: A | Discharge status: Home / Self Care | Payer: Medicare HMO | Source: Ambulatory Visit | Attending: Surgery | Admitting: Surgery

## 2016-12-12 ENCOUNTER — Ambulatory Visit: Payer: Medicare HMO | Admitting: Surgery

## 2016-12-12 VITALS — BP 121/70 | HR 75 | Temp 96.9°F | Resp 16 | Ht 68.0 in | Wt 167.0 lb

## 2016-12-12 DIAGNOSIS — I6521 Occlusion and stenosis of right carotid artery: Secondary | ICD-10-CM | POA: Insufficient documentation

## 2016-12-12 DIAGNOSIS — Z9889 Other specified postprocedural states: Secondary | ICD-10-CM | POA: Diagnosis not present

## 2016-12-12 LAB — VAS US CAROTID
LCCAPDIAS: 20 cm/s
LCCAPSYS: 116 cm/s
LEFT ECA DIAS: -14 cm/s
LICADDIAS: -27 cm/s
Left CCA dist dias: -28 cm/s
Left CCA dist sys: -110 cm/s
Left ICA dist sys: -67 cm/s
Left ICA prox dias: -29 cm/s
Left ICA prox sys: -79 cm/s
RCCADSYS: -90 cm/s
RIGHT CCA MID DIAS: 12 cm/s
RIGHT ECA DIAS: -9 cm/s
RIGHT VERTEBRAL DIAS: -11 cm/s
Right CCA prox dias: 15 cm/s
Right CCA prox sys: 106 cm/s

## 2016-12-12 NOTE — Progress Notes (Signed)
[facility]  Patient name: [name] MRN: [id number] DOB: 06-12-1945 Sex: male   REASON FOR VISIT:    Follow up  HISOTRY OF PRESENT ILLNESS:    Terry RasJames M Meller is a 71 y.o. male returns today for follow-up.  He is status post right carotid endarterectomy on 12/11/2014.  This was done for asymptomatic right carotid stenosis.  Intraoperative findings included a 90% stenosis.  This was a very difficult repair.  His lesion was surrounded by a dense inflammatory tissue.  The distal extent of the disease was approximately 3 cm beyond the carotid bifurcation.  The lesion also went down below the clavicle.  The common carotid artery was closed primarily and a bovine pericardial patch angioplasty was performed of the distal common carotid and internal carotid artery.  His postoperative course was uncomplicated.  He is here for follow-up.  He reports no neurologic symptoms.  He denies any symptoms of claudication.   PAST MEDICAL HISTORY:   Past Medical History:  Diagnosis Date  . Bilateral carotid artery disease (HCC)   . CAD s/p CABG 2009 10/31/2012   Free LIMA to LAD (left subclavian artery occlusion), SVG to PDA, SVG to oblique marginal, SVG to ramus intermedius. Previous stents in the LAD artery and left circumflex coronary artery   . Complication of anesthesia    pt. states that he has a difficult time breathing when waking up from anesthesia  . History of anxiety   . History of depression   . HTN (hypertension) 10/31/2012  . Hyperlipidemia 10/31/2012  . 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 Stroke [facility](HCC)    pt. states that he was told he had a stroke many years ago but states no deficits.   . Urinary urgency      FAMILY HISTORY:   Family History  Problem Relation Age of Onset  . Congestive Heart Failure Mother   . Stroke Father   . Heart attack Maternal Grandmother   .  Heart attack Maternal Grandfather     SOCIAL HISTORY:   Social History   Tobacco Use  . Smoking status: Current Some Day Smoker    Packs/day: 0.25    Types: Cigarettes  . Smokeless tobacco: Never Used  . Tobacco comment: On "patches"  Substance Use Topics  . Alcohol use: Yes    Alcohol/week: 2.4 oz    Types: 4 Cans of beer per week    Comment: daily;      ALLERGIES:   Allergies  Allergen Reactions  . Oxycodone-Acetaminophen Rash    Rash  "facial blotches"      CURRENT MEDICATIONS:   Current Outpatient Medications  Medication Sig Dispense Refill  . aspirin EC 81 MG tablet Take 81 mg by mouth daily.    . carvedilol (COREG) 3.125 MG tablet Take 1 tablet (3.125 mg total) by mouth 2 (two) times daily. 90 tablet 3  . clopidogrel (PLAVIX) 75 MG tablet Take 1 tablet (75 mg total) by mouth daily. 90 tablet 3  . furosemide (LASIX) 20 MG tablet Take 20 mg by mouth.    . isosorbide mononitrate (IMDUR) 30 MG 24 hr tablet Take 1 tablet (30 mg total) by mouth at bedtime. 90 tablet 3  . lisinopril (PRINIVIL,ZESTRIL) 5 MG tablet Take 1 tablet (5 mg total) by mouth daily. 90 tablet 3  . nitroGLYCERIN (NITROSTAT) 0.4 MG SL tablet Place 1 tablet (0.4 mg total) under the tongue every 5 (  five) minutes as needed for chest pain. 25 tablet 8  . pantoprazole (PROTONIX) 40 MG tablet Take 1 tablet (40 mg total) by mouth daily. 90 tablet 3  . potassium chloride (KLOR-CON) 20 MEQ packet Take 2 (two) times daily by mouth.    Marland Kitchen. RANEXA 1000 MG SR tablet TAKE ONE TABLET BY MOUTH TWICE A DAY 60 each 11  . simvastatin (ZOCOR) 20 MG tablet Take 1 tablet (20 mg total) by mouth every evening. 90 tablet 3   No current facility-administered medications for this visit.     REVIEW OF SYSTEMS:   [X]  denotes positive finding, [ ]  denotes negative finding Cardiac  Comments:  Chest pain or chest pressure:  I cannot palpate pedal pulses.  No carotid bruits.   Shortness of breath upon exertion:    Short of  breath when lying flat:    Irregular heart rhythm:        Vascular    Pain in calf, thigh, or hip brought on by ambulation:    Pain in feet at night that wakes you up from your sleep:     Blood clot in your veins:    Leg swelling:         Pulmonary    Oxygen at home:    Productive cough:     Wheezing:         Neurologic    Sudden weakness in arms or legs:     Sudden numbness in arms or legs:     Sudden onset of difficulty speaking or slurred speech:    Temporary loss of vision in one eye:     Problems with dizziness:         Gastrointestinal    Blood in stool:     Vomited blood:         Genitourinary    Burning when urinating:     Blood in urine:        Psychiatric    Major depression:         Hematologic    Bleeding problems:    Problems with blood clotting too easily:        Skin    Rashes or ulcers:        Constitutional    Fever or chills:      PHYSICAL EXAM:   Vitals:   12/12/16 1042  BP: 121/70  Pulse: 75  Resp: 16  Temp: (!) 96.9 F (36.1 C)  TempSrc: Oral  SpO2: 98%  Weight: 167 lb (75.8 kg)  Height: 5\' 8"  (1.727 m)    GENERAL: The patient is a well-nourished male, in no acute distress. The vital signs are documented above. CARDIAC: There is a regular rate and rhythm.  VASCULAR: Pedal pulses are not palpable.  No carotid bruits. PULMONARY: Non-labored respirations ABDOMEN: Soft and non-tender no pulsatile mass MUSCULOSKELETAL: There are no major deformities or cyanosis. NEUROLOGIC: No focal weakness or paresthesias are detected. SKIN: There are no ulcers or rashes noted. PSYCHIATRIC: The patient has a normal affect.  STUDIES:   I have ordered and reviewed his carotid duplex.  He has 1-39% stenosis bilaterally.  There is mild neointimal hyperplasia in the proximal section of the right internal carotid artery  MEDICAL ISSUES:   History of carotid endarterectomy: The patient is doing very well at this time.  Ultrasound today shows no  evidence of stenosis.  He will continue with annual surveillance.  The next study will be in 1 year.  Annamarie Major, MD [facility] [phone] Pager [phone]

## 2016-12-14 NOTE — Addendum Note (Signed)
Addended by: Burton ApleyPETTY, Chloris Marcoux A on: 12/14/2016 10:43 AM   Modules accepted: Orders

## 2017-03-01 ENCOUNTER — Other Ambulatory Visit: Payer: Self-pay | Admitting: Cardiovascular Disease

## 2017-03-02 NOTE — Telephone Encounter (Signed)
Rx has been sent to the pharmacy electronically. ° °

## 2017-03-22 ENCOUNTER — Telehealth: Payer: Self-pay | Admitting: Cardiovascular Disease

## 2017-03-22 ENCOUNTER — Other Ambulatory Visit: Payer: Self-pay | Admitting: Cardiovascular Disease

## 2017-03-22 NOTE — Telephone Encounter (Signed)
Closed Encounter  °

## 2017-03-25 ENCOUNTER — Other Ambulatory Visit: Payer: Self-pay | Admitting: Cardiovascular Disease

## 2017-03-27 NOTE — Telephone Encounter (Signed)
Rx(s) sent to pharmacy electronically.  

## 2017-04-07 ENCOUNTER — Other Ambulatory Visit: Payer: Self-pay | Admitting: Cardiovascular Disease

## 2017-04-07 MED ORDER — SIMVASTATIN 20 MG PO TABS: 20.0000 mg | ORAL_TABLET | Freq: Every day | ORAL | 2 refills | Status: DC

## 2017-04-07 NOTE — Telephone Encounter (Signed)
REFILL 

## 2017-04-07 NOTE — Telephone Encounter (Signed)
°*  STAT* If patient is at the pharmacy, call can be transferred to refill team.   1. Which medications need to be refilled? (please list name of each medication and dose if known) simvastatin 20 mg   2. Which pharmacy/location (including street and city if local pharmacy) is medication to be sent to?[facility] - [place], [place] - [address]  3. Do they need a 30 day or 90 day supply? 90  Patient scheduled for 07-05-17 with Dr. Royann Shiversroitoru

## 2017-04-17 DIAGNOSIS — I739 Peripheral vascular disease, unspecified: Secondary | ICD-10-CM | POA: Diagnosis not present

## 2017-04-17 DIAGNOSIS — I779 Disorder of arteries and arterioles, unspecified: Secondary | ICD-10-CM | POA: Diagnosis not present

## 2017-04-17 DIAGNOSIS — M47816 Spondylosis without myelopathy or radiculopathy, lumbar region: Secondary | ICD-10-CM | POA: Diagnosis not present

## 2017-04-17 DIAGNOSIS — Z6827 Body mass index (BMI) 27.0-27.9, adult: Secondary | ICD-10-CM | POA: Diagnosis not present

## 2017-04-17 DIAGNOSIS — G4762 Sleep related leg cramps: Secondary | ICD-10-CM | POA: Diagnosis not present

## 2017-04-17 DIAGNOSIS — E663 Overweight: Secondary | ICD-10-CM | POA: Diagnosis not present

## 2017-04-17 DIAGNOSIS — E782 Mixed hyperlipidemia: Secondary | ICD-10-CM | POA: Diagnosis not present

## 2017-04-17 DIAGNOSIS — M545 Low back pain: Secondary | ICD-10-CM | POA: Diagnosis not present

## 2017-04-17 DIAGNOSIS — Z1389 Encounter for screening for other disorder: Secondary | ICD-10-CM | POA: Diagnosis not present

## 2017-06-09 ENCOUNTER — Other Ambulatory Visit: Payer: Self-pay | Admitting: Cardiovascular Disease

## 2017-06-09 MED ORDER — CARVEDILOL 3.125 MG PO TABS: 3.1250 mg | ORAL_TABLET | Freq: Two times a day (BID) | ORAL | 1 refills | Status: DC

## 2017-06-09 NOTE — Telephone Encounter (Signed)
New Message   Melissa at [facility] pharmacy states that a prescription was sent over for a 30 day but they need a 90 day supply. Please advise   *STAT* If patient is at the pharmacy, call can be transferred to refill team.   1. Which medications need to be refilled? (please list name of each medication and dose if known) carvedilol (COREG) 3.125 MG tablet  2. Which pharmacy/location (including street and city if local pharmacy) is medication to be sent to? [facility] - [place], [place] - [address]  3. Do they need a 30 day or 90 day supply? 90

## 2017-06-09 NOTE — Telephone Encounter (Signed)
Rx request sent to pharmacy.  

## 2017-06-22 DIAGNOSIS — E782 Mixed hyperlipidemia: Secondary | ICD-10-CM | POA: Diagnosis not present

## 2017-06-22 DIAGNOSIS — Z1389 Encounter for screening for other disorder: Secondary | ICD-10-CM | POA: Diagnosis not present

## 2017-06-22 DIAGNOSIS — Z0000 Encounter for general adult medical examination without abnormal findings: Secondary | ICD-10-CM | POA: Diagnosis not present

## 2017-06-22 DIAGNOSIS — I2510 Atherosclerotic heart disease of native coronary artery without angina pectoris: Secondary | ICD-10-CM | POA: Diagnosis not present

## 2017-06-22 DIAGNOSIS — E663 Overweight: Secondary | ICD-10-CM | POA: Diagnosis not present

## 2017-06-22 DIAGNOSIS — Z6826 Body mass index (BMI) 26.0-26.9, adult: Secondary | ICD-10-CM | POA: Diagnosis not present

## 2017-06-22 DIAGNOSIS — I10 Essential (primary) hypertension: Secondary | ICD-10-CM | POA: Diagnosis not present

## 2017-06-22 DIAGNOSIS — I739 Peripheral vascular disease, unspecified: Secondary | ICD-10-CM | POA: Diagnosis not present

## 2017-06-22 DIAGNOSIS — E748 Other specified disorders of carbohydrate metabolism: Secondary | ICD-10-CM | POA: Diagnosis not present

## 2017-07-04 ENCOUNTER — Other Ambulatory Visit: Payer: Self-pay | Admitting: Cardiovascular Disease

## 2017-07-05 ENCOUNTER — Encounter

## 2017-07-05 ENCOUNTER — Ambulatory Visit: Payer: Medicare HMO | Admitting: Cardiovascular Disease

## 2017-07-28 ENCOUNTER — Other Ambulatory Visit: Payer: Self-pay | Admitting: Cardiovascular Disease

## 2017-08-15 DIAGNOSIS — F418 Other specified anxiety disorders: Secondary | ICD-10-CM | POA: Diagnosis not present

## 2017-08-15 DIAGNOSIS — Z6826 Body mass index (BMI) 26.0-26.9, adult: Secondary | ICD-10-CM | POA: Diagnosis not present

## 2017-08-15 DIAGNOSIS — E663 Overweight: Secondary | ICD-10-CM | POA: Diagnosis not present

## 2017-08-25 ENCOUNTER — Other Ambulatory Visit: Payer: Self-pay | Admitting: Cardiovascular Disease

## 2017-09-07 ENCOUNTER — Other Ambulatory Visit: Payer: Self-pay | Admitting: Cardiovascular Disease

## 2017-09-07 MED ORDER — CLOPIDOGREL BISULFATE 75 MG PO TABS: 75.0000 mg | ORAL_TABLET | Freq: Every day | ORAL | 0 refills | Status: DC

## 2017-09-07 NOTE — Telephone Encounter (Signed)
°*  STAT* If patient is at the pharmacy, call can be transferred to refill team.   1. Which medications need to be refilled? (please list name of each medication and dose if known) Clopidogrel- need enough until his Appt on 09-22-17   .2.. Which pharmacy/location (including street and city if local pharmacy) is medication to be sent to? [place] [phone]  3. Do they need a 30 day or 90 day supply? Need enough until his app on 09-22-17 -

## 2017-09-22 ENCOUNTER — Ambulatory Visit: Payer: Medicare HMO | Admitting: Cardiovascular Disease

## 2017-09-22 ENCOUNTER — Encounter: Payer: Self-pay | Admitting: Cardiovascular Disease

## 2017-09-22 VITALS — BP 136/70 | HR 55 | Ht 68.0 in | Wt 167.2 lb

## 2017-09-22 DIAGNOSIS — I739 Peripheral vascular disease, unspecified: Secondary | ICD-10-CM

## 2017-09-22 DIAGNOSIS — Z720 Tobacco use: Secondary | ICD-10-CM

## 2017-09-22 DIAGNOSIS — E782 Mixed hyperlipidemia: Secondary | ICD-10-CM | POA: Diagnosis not present

## 2017-09-22 DIAGNOSIS — R9431 Abnormal electrocardiogram [ECG] [EKG]: Secondary | ICD-10-CM | POA: Diagnosis not present

## 2017-09-22 DIAGNOSIS — K219 Gastro-esophageal reflux disease without esophagitis: Secondary | ICD-10-CM

## 2017-09-22 DIAGNOSIS — I25118 Atherosclerotic heart disease of native coronary artery with other forms of angina pectoris: Secondary | ICD-10-CM

## 2017-09-22 MED ORDER — RANITIDINE HCL 150 MG PO TABS: 150.0000 mg | ORAL_TABLET | Freq: Every day | ORAL | 3 refills | Status: AC

## 2017-09-22 MED ORDER — SIMVASTATIN 40 MG PO TABS: 40.0000 mg | ORAL_TABLET | Freq: Every day | ORAL | 3 refills | Status: AC

## 2017-09-22 NOTE — Patient Instructions (Signed)
Dr Royann Shiversroitoru has recommended making the following medication changes: 1. INCREASE Simvastatin to 40 mg daily 2. START Ranitidine 150 mg at bedtime  Your physician recommends that you schedule a follow-up appointment in 12 months. You will receive a reminder letter in the mail two months in advance. If you don't receive a letter, please call our office to schedule the follow-up appointment.  If you need a refill on your cardiac medications before your next appointment, please call your pharmacy.

## 2017-09-22 NOTE — Progress Notes (Signed)
Cardiology Office Note    Date:  09/24/2017   ID:  [name], DOB 1945-02-13, MRN [id number]  PCP:  Elfredia Nevins, MD  Cardiologist:   Thurmon Fair, MD   No chief complaint on file.   History of Present Illness:  [name] is a 72 y.o. male with long-standing coronary artery disease, stents to LAD and LCX and bypass surgery in 2009 (SVG to PDA, SVG to OM, free LIMA to LAD; SVG to ramus occluded at repeat cath 2013), PAD (known occlusion of the left subclavian artery, history of right carotid endarterectomy), hypertension, hyperlipidemia, returning for routine follow-up.  He is known to have diffuse distal disease in the territory of all 3 major coronary arteries, not amenable to revascularization and is therefore receiving medical therapy.   He ran out of Ranexa and has also not been taking isosorbide mononitrate for a couple of months.  Despite this he is only having infrequent angina.  It happens on the average may be twice a month with physical activity and resolves promptly with rest.  He has not taken any nitroglycerin.  He plays golf regularly.  He has a different kind of chest discomfort that is relieved by Tums.  He has noticed that the Protonix does not really offer 24-hour protection.  He still smoking half a pack of cigarettes a day even after trying to quit using Chantix, Wellbutrin and nicotine patches.  His most recent lipid profile showed an LDL cholesterol of 98.  HDL 38, triglycerides 303.  He denies edema, claudication, focal neurological events, palpitations or syncope. (He had a syncopal event in the summer of 2015, probably due to dehydration).  Past Medical History:  Diagnosis Date  . Bilateral carotid artery disease (HCC)   . CAD s/p CABG 2009 10/31/2012   Free LIMA to LAD (left subclavian artery occlusion), SVG to PDA, SVG to oblique marginal, SVG to ramus intermedius. Previous stents in the LAD artery and left circumflex coronary artery   .  Complication of anesthesia    pt. states that he has a difficult time breathing when waking up from anesthesia  . History of anxiety   . History of depression   . HTN (hypertension) 10/31/2012  . Hyperlipidemia 10/31/2012  . 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 Stroke [facility])    pt. states that he was told he had a stroke many years ago but states no deficits.   . Urinary urgency     Past Surgical History:  Procedure Laterality Date  . CHOLECYSTECTOMY    . COLONOSCOPY    . CORONARY ARTERY BYPASS GRAFT  2009  . ENDARTERECTOMY Right 12/11/2014   Procedure: RIGHT CAROTID ENDARTERECTOMY WITH BOVINE PERICARDIAL PATCH ANGIOPLASTY;  Surgeon: Nada Libman, MD;  Location: [facility] OR;  Service: Vascular;  Laterality: Right;  . LEFT HEART CATHETERIZATION WITH CORONARY/GRAFT ANGIOGRAM N/A 06/08/2011   Procedure: LEFT HEART CATHETERIZATION WITH Isabel Caprice;  Surgeon: Thurmon Fair, MD;  Location: [facility] CATH LAB;  Service: Cardiovascular;  Laterality: N/A;  . ROTATOR CUFF REPAIR Bilateral     Current Medications: Outpatient Medications Prior to Visit  Medication Sig Dispense Refill  . aspirin EC 81 MG tablet Take 81 mg by mouth daily.    . carvedilol (COREG) 3.125 MG tablet Take 1 tablet (3.125 mg total) by mouth 2 (two) times daily with a meal. 180 tablet 1  . clopidogrel (PLAVIX) 75 MG tablet Take 1 tablet (75 mg total) by  mouth daily. MUST KEEP APPT FOR FUTURE REFILLS 30 tablet 0  . furosemide (LASIX) 20 MG tablet Take 20 mg by mouth.    Marland Kitchen lisinopril (PRINIVIL,ZESTRIL) 5 MG tablet TAKE ONE (1) TABLET BY MOUTH EVERY DAY **NEEDS OFFICE VISIT** 30 tablet 1  . nitroGLYCERIN (NITROSTAT) 0.4 MG SL tablet Place 1 tablet (0.4 mg total) under the tongue every 5 (five) minutes as needed for chest pain. 25 tablet 8  . pantoprazole (PROTONIX) 40 MG tablet Take 1 tablet (40 mg total) by mouth daily. 90 tablet 3  . potassium chloride (KLOR-CON) 20 MEQ packet Take 2  (two) times daily by mouth.    . simvastatin (ZOCOR) 20 MG tablet TAKE ONE TABLET (20MG  TOTAL) BY MOUTH DAILY AT 6PM 90 tablet 1  . isosorbide mononitrate (IMDUR) 30 MG 24 hr tablet Take 1 tablet (30 mg total) by mouth at bedtime. (Patient not taking: Reported on 09/22/2017) 90 tablet 3  . RANEXA 1000 MG SR tablet TAKE ONE TABLET BY MOUTH TWICE A DAY (Patient not taking: Reported on 09/22/2017) 60 each 11   No facility-administered medications prior to visit.      Allergies:   Oxycodone-acetaminophen   Social History   Socioeconomic History  . Marital status: Legally Separated    Spouse name: Not on file  . Number of children: Not on file  . Years of education: Not on file  . Highest education level: Not on file  Occupational History  . Not on file  Social Needs  . Financial resource strain: Not on file  . Food insecurity:    Worry: Not on file    Inability: Not on file  . Transportation needs:    Medical: Not on file    Non-medical: Not on file  Tobacco Use  . Smoking status: Current Some Day Smoker    Packs/day: 0.25    Types: Cigarettes  . Smokeless tobacco: Never Used  . Tobacco comment: On "patches"  Substance and Sexual Activity  . Alcohol use: Yes    Alcohol/week: 4.0 standard drinks    Types: 4 Cans of beer per week    Comment: daily;   . Drug use: No  . Sexual activity: Not on file  Lifestyle  . Physical activity:    Days per week: Not on file    Minutes per session: Not on file  . Stress: Not on file  Relationships  . Social connections:    Talks on phone: Not on file    Gets together: Not on file    Attends religious service: Not on file    Active member of club or organization: Not on file    Attends meetings of clubs or organizations: Not on file    Relationship status: Not on file  Other Topics Concern  . Not on file  Social History Narrative  . Not on file     Family History:  The patient's family history includes Congestive Heart Failure in  his mother; Heart attack in his maternal grandfather and maternal grandmother; Stroke in his father.   ROS:   Please see the history of present illness.    ROS All other systems reviewed and are negative.   PHYSICAL EXAM:   VS:  BP 136/70   Pulse (!) 55   Ht 5\' 8"  (1.727 m)   Wt 167 lb 3.2 oz (75.8 kg)   BMI 25.42 kg/m     General: Alert, oriented x3, no distress, lean Head: no evidence of trauma,  PERRL, EOMI, no exophtalmos or lid lag, no myxedema, no xanthelasma; normal ears, nose and oropharynx Neck: normal jugular venous pulsations and no hepatojugular reflux; brisk carotid pulses without delay, but with Bilateral carotid bruits, well-healed right endarterectomy scar, no goiter or masses Chest: clear to auscultation, no signs of consolidation by percussion or palpation, normal fremitus, symmetrical and full respiratory excursions Cardiovascular: normal position and quality of the apical impulse, regular rhythm, normal first and second heart sounds, no murmurs, rubs or gallops Abdomen: no tenderness or distention, no masses by palpation, no abnormal pulsatility or arterial bruits, normal bowel sounds, no hepatosplenomegaly Extremities: no clubbing, cyanosis or edema;Much weaker pulse in left radial/left ulnar compared to the right; 2+ right femoral, posterior tibial and dorsalis pedis pulses; 2+ left femoral, posterior tibial and dorsalis pedis pulses; no subclavian or femoral bruits Neurological: grossly nonfocal Psych: Normal mood and affect  Wt Readings from Last 3 Encounters:  09/22/17 167 lb 3.2 oz (75.8 kg)  12/12/16 167 lb (75.8 kg)  02/04/16 170 lb (77.1 kg)      Studies/Labs Reviewed:   EKG:  EKG is  ordered today.  Today's ECG shows sinus rhythm with first-degree AV block (PR 222 ms), atypical left bundle branch block (QRS 146 ms), QTC 447 ms  Recent Labs: Labs earlier this year Total cholesterol 197, LDL 98, HDL 38, triglycerides 303, Normal liver function test,  potassium 4.8, creatinine 1.2, hemoglobin A1c 6.1%, normal TSH, hemoglobin 16.1  Lipid Panel    Component Value Date/Time   CHOL 186 12/25/2012 0852   TRIG 206 (H) 12/25/2012 0852   HDL 47 12/25/2012 0852   CHOLHDL 4.0 12/25/2012 0852   VLDL 41 (H) 12/25/2012 0852   LDLCALC 98 12/25/2012 0852     ASSESSMENT:    1. Coronary artery disease of native artery of native heart with stable angina pectoris (HCC)   2. PAD (peripheral artery disease) (HCC)   3. Mixed hyperlipidemia   4. Tobacco abuse   5. Long QT interval   6. Gastroesophageal reflux disease, esophagitis presence not specified      PLAN:  In order of problems listed above:  1. CAD: Although he seemed to gain benefit from Ranexa and isosorbide mononitrate in the past, he is now doing well without these medications.  Continue carvedilol.  Higher doses have been limited by bradycardia and hypertension.  We always have the option to resume long-acting nitrates and/or Ranexa if necessary. 2. PAD: Known chronic occlusion of the left subclavian artery, blood pressure should always be checked only on the right side. Carotid ultrasound followed by Dr. Myra GianottiBrabham. No evidence of significant stenoses on either side. 3. HLP: Since he is no longer using Ranexa we can use higher dose of simvastatin.  Target LDL cholesterol less than 70. 4. Smoking: I asked him to try quitting again and again.  Even if he has been only temporarily successful in the past, that does not mean that this is not a worthwhile endeavor. 5. Long QT: Mild and only seen intermittently.  No history of ventricular arrhythmia.  Acute on today's tracing. 6. GERD: Since his PPI seems to not provide 24-hour protection, I asked him to start taking an H2 blocker at bedtime in addition to the morning Protonix.   Medication Adjustments/Labs and Tests Ordered: Current medicines are reviewed at length with the patient today.  Concerns regarding medicines are outlined above.   Medication changes, Labs and Tests ordered today are listed in the Patient Instructions below. Patient Instructions  Dr Royann Shivers has recommended making the following medication changes: 1. INCREASE Simvastatin to 40 mg daily 2. START Ranitidine 150 mg at bedtime  Your physician recommends that you schedule a follow-up appointment in 12 months. You will receive a reminder letter in the mail two months in advance. If you don't receive a letter, please call our office to schedule the follow-up appointment.  If you need a refill on your cardiac medications before your next appointment, please call your pharmacy.    Signed, Thurmon Fair, MD  09/24/2017 5:55 PM    [facility] [address] [place], [place], [place]  [address] Phone: [phone]; Fax: [phone]

## 2017-10-13 ENCOUNTER — Other Ambulatory Visit: Payer: Self-pay | Admitting: Cardiovascular Disease

## 2017-10-17 ENCOUNTER — Telehealth: Payer: Self-pay | Admitting: Cardiovascular Disease

## 2017-10-17 MED ORDER — LISINOPRIL 5 MG PO TABS: ORAL_TABLET | ORAL | 3 refills | Status: AC

## 2017-10-17 MED ORDER — CARVEDILOL 3.125 MG PO TABS: 3.1250 mg | ORAL_TABLET | Freq: Two times a day (BID) | ORAL | 3 refills | Status: AC

## 2017-10-17 MED ORDER — CLOPIDOGREL BISULFATE 75 MG PO TABS: 75.0000 mg | ORAL_TABLET | Freq: Every day | ORAL | 3 refills | Status: AC

## 2017-10-17 NOTE — Telephone Encounter (Signed)
New message   *STAT* If patient is at the pharmacy, call can be transferred to refill team.   1. Which medications need to be refilled? (please list name of each medication and dose if known) clopidogrel (PLAVIX) 75 MG tablet     lisinopril (PRINIVIL,ZESTRIL) 5 MG tablet   carvedilol (COREG) 3.125 MG tablet     2. Which pharmacy/location (including street and city if local pharmacy) is medication to be sent to?[facility]   3. Do they need a 30 day or 90 day supply? 90

## 2017-11-19 DIAGNOSIS — R4020 Unspecified coma: Secondary | ICD-10-CM | POA: Diagnosis not present

## 2017-11-19 DIAGNOSIS — R404 Transient alteration of awareness: Secondary | ICD-10-CM | POA: Diagnosis not present

## 2017-11-19 DIAGNOSIS — I469 Cardiac arrest, cause unspecified: Secondary | ICD-10-CM | POA: Diagnosis not present

## 2017-11-24 DIAGNOSIS — 419620001 Death: Secondary | SNOMED CT | POA: Diagnosis not present

## 2017-11-24 DEATH — deceased
